# Patient Record
Sex: Female | Born: 1985 | Race: White | Hispanic: No | Smoking: Never smoker
Health system: Southern US, Community
[De-identification: ages and names within clinical notes are randomized; demographics above are authoritative.]

## PROBLEM LIST (undated history)

## (undated) VITALS — BP 136/94 | HR 92 | Temp 98.7°F | Resp 18 | Ht 70.0 in | Wt 190.0 lb

## (undated) DIAGNOSIS — Z8679 Personal history of other diseases of the circulatory system: Secondary | ICD-10-CM

## (undated) DIAGNOSIS — Z8619 Personal history of other infectious and parasitic diseases: Secondary | ICD-10-CM

## (undated) DIAGNOSIS — Z87898 Personal history of other specified conditions: Secondary | ICD-10-CM

## (undated) DIAGNOSIS — Z8709 Personal history of other diseases of the respiratory system: Secondary | ICD-10-CM

## (undated) DIAGNOSIS — Z87440 Personal history of urinary (tract) infections: Secondary | ICD-10-CM

## (undated) DIAGNOSIS — K219 Gastro-esophageal reflux disease without esophagitis: Secondary | ICD-10-CM

## (undated) DIAGNOSIS — E669 Obesity, unspecified: Secondary | ICD-10-CM

## (undated) DIAGNOSIS — F329 Major depressive disorder, single episode, unspecified: Secondary | ICD-10-CM

## (undated) DIAGNOSIS — Z8719 Personal history of other diseases of the digestive system: Secondary | ICD-10-CM

## (undated) DIAGNOSIS — F319 Bipolar disorder, unspecified: Secondary | ICD-10-CM

## (undated) DIAGNOSIS — F32A Depression, unspecified: Secondary | ICD-10-CM

## (undated) DIAGNOSIS — Z8669 Personal history of other diseases of the nervous system and sense organs: Secondary | ICD-10-CM

## (undated) HISTORY — DX: Personal history of other diseases of the respiratory system: Z87.09

## (undated) HISTORY — DX: Gastro-esophageal reflux disease without esophagitis: K21.9

## (undated) HISTORY — DX: Personal history of other diseases of the digestive system: Z87.19

## (undated) HISTORY — DX: Personal history of other infectious and parasitic diseases: Z86.19

## (undated) HISTORY — DX: Personal history of urinary (tract) infections: Z87.440

## (undated) HISTORY — DX: Personal history of other diseases of the nervous system and sense organs: Z86.69

## (undated) HISTORY — DX: Personal history of other specified conditions: Z87.898

## (undated) HISTORY — PX: OTHER SURGICAL HISTORY: SHX169

## (undated) HISTORY — DX: Personal history of other diseases of the circulatory system: Z86.79

---
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
## 2005-08-13 ENCOUNTER — Encounter (INDEPENDENT_AMBULATORY_CARE_PROVIDER_SITE_OTHER): Payer: Self-pay | Admitting: Specialist

## 2005-08-13 ENCOUNTER — Ambulatory Visit (HOSPITAL_COMMUNITY): Admission: RE | Admit: 2005-08-13 | Discharge: 2005-08-13 | Payer: Self-pay | Admitting: Obstetrics and Gynecology

## 2007-07-01 ENCOUNTER — Inpatient Hospital Stay (HOSPITAL_COMMUNITY): Admission: AD | Admit: 2007-07-01 | Discharge: 2007-07-01 | Payer: Self-pay | Admitting: Obstetrics

## 2007-10-28 ENCOUNTER — Encounter: Admission: RE | Admit: 2007-10-28 | Discharge: 2007-10-28 | Payer: Self-pay | Admitting: Emergency Medicine

## 2008-01-13 ENCOUNTER — Ambulatory Visit: Payer: Self-pay | Admitting: Gastroenterology

## 2008-09-22 HISTORY — PX: GASTRIC BYPASS: SHX52

## 2008-11-02 ENCOUNTER — Ambulatory Visit: Payer: Self-pay | Admitting: Family Medicine

## 2008-11-02 DIAGNOSIS — F411 Generalized anxiety disorder: Secondary | ICD-10-CM | POA: Insufficient documentation

## 2008-11-02 DIAGNOSIS — J209 Acute bronchitis, unspecified: Secondary | ICD-10-CM

## 2008-11-02 DIAGNOSIS — F3289 Other specified depressive episodes: Secondary | ICD-10-CM | POA: Insufficient documentation

## 2008-11-02 DIAGNOSIS — F329 Major depressive disorder, single episode, unspecified: Secondary | ICD-10-CM

## 2008-11-02 DIAGNOSIS — J019 Acute sinusitis, unspecified: Secondary | ICD-10-CM

## 2008-11-05 ENCOUNTER — Emergency Department (HOSPITAL_COMMUNITY): Admission: EM | Admit: 2008-11-05 | Discharge: 2008-11-05 | Payer: Self-pay | Admitting: Emergency Medicine

## 2008-12-21 HISTORY — PX: GASTRIC BYPASS: SHX52

## 2009-08-07 ENCOUNTER — Ambulatory Visit: Payer: Self-pay | Admitting: Family Medicine

## 2009-08-07 DIAGNOSIS — M940 Chondrocostal junction syndrome [Tietze]: Secondary | ICD-10-CM | POA: Insufficient documentation

## 2011-02-04 NOTE — Letter (Signed)
January 13, 2008    Reuben Likes, M.D.  [address]  [place], [place] [address]   RE:  CLARAMAE, RIGDON  MRN:  [id number]  /  DOB:  [birth year]   Dear Dr. Lorenz Coaster:   Upon your kind referral, I had the pleasure of evaluating your patient  and I am pleased to offer my findings.  I saw Tesha Archambeau in the office  today.  Enclosed is a copy of my progress note that details my findings  and recommendations.   Thank you for the opportunity to participate in your patient's care.    Sincerely,      Barbette Hair. Arlyce Dice, MD,FACG  Electronically Signed    RDK/MedQ  DD: 01/13/2008  DT: 01/13/2008  Job #: [id number]

## 2011-02-04 NOTE — Letter (Signed)
January 13, 2008    Oliver Hum   RE:  DORAL, DIGANGI  MRN:  [id number]  /  DOB:  [birth year]   Dear Ms. Argyle:   It is my pleasure to have treated you recently as a new patient in my  office.  I appreciate your confidence and the opportunity to participate  in your care.   Since I do have a busy inpatient endoscopy schedule and office schedule,  my office hours vary weekly.  I am, however, available for emergency  calls every day through my office.  If I cannot promptly meet an urgent  office appointment, another one of our gastroenterologists will be able  to assist you.   My well-trained staff are prepared to help you at all times.  For  emergencies after office hours, a physician from our gastroenterology  section is always available through my 24-hour answering service.   While you are under my care, I encourage discussion of your questions  and concerns, and I will be happy to return your calls as soon as I am  available.   Once again, I welcome you as a new patient and I look forward to a happy  and healthy relationship.    Sincerely,      Barbette Hair. Arlyce Dice, MD,FACG  Electronically Signed   RDK/MedQ  DD: 01/13/2008  DT: 01/13/2008  Job #: [id number]

## 2011-02-04 NOTE — Assessment & Plan Note (Signed)
[facility]                         GASTROENTEROLOGY OFFICE NOTE   Cindy [name], Cindy [name]                          MRN:          [id number]  DATE:01/13/2008                            DOB:          05/17/1986    REASON FOR CONSULTATION:  Abdominal pain.   Cindy [name] is a pleasant 25 year old white female referred through the  courtesy of Dr. Lorenz Coaster for evaluation.  Since August, 2008, she has been  bothered by left lower quadrant pain.  The pain is described as sharp  and may last several hours at a time.  It is unrelated to bowel  movements.  It is made neither better nor worse with bowel movements.  It is also unchanged by position including bending and turning.  He has  erratic bowels consisting of alternating episodes of diarrhea and  constipation.  Rarely has she seen small amounts of blood on the toilet  tissue.  She has taken Bentyl without much relief.  Her other main GI  complaint is pyrosis.  This is a persistent problem.  She has had  coughing and a sore throat despite taking Prilosec.  She complains of  excessive belching.  She denies dysphagia or odynophagia.  She noted the  onset of her left lower quadrant pain when she was pregnant in August.  The pregnancy was terminated.   PAST MEDICAL HISTORY:  Pertinent for:  1. Anxiety.  2. Depression.   FAMILY HISTORY:  Positive for father with heart disease.   MEDICATIONS:  Align, dicyclomine, and Prilosec.   SHE IS ALLERGIC TO CECLOR (SWELLING).   She does not smoke.  She drinks a few drinks a week.  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   REVIEW OF SYSTEMS:  Positive for feet swelling, sleeping problems, back  pain and muscle cramps, otherwise negative.   PHYSICAL EXAMINATION:  She is a healthy appearing female, pulse 82,  blood pressure 118/82, weight 239.  HEENT:  EOMI.  PERRLA.  Sclerae are anicteric.  Conjunctivae are pink.  NECK:  Supple without thyromegaly,  adenopathy or carotid bruits.  CHEST:  Clear to auscultation and percussion without adventitious  sounds.  CARDIAC:  Regular rhythm; normal S1 S2.  There are no murmurs, gallops  or rubs.  ABDOMEN:  There is minimal tenderness to deep palpation in the left  lower quadrant in the area of the left groin.  There are no abdominal  masses or organomegaly.  There are no frank hernias.  EXTREMITIES:  Full range of motion.  No cyanosis, clubbing or edema.  RECTAL:  Deferred.  SKELETAL:  There are no skeletal abnormalities including skeletal  deformities.  NEUROLOGIC:  Nonfocal.   IMPRESSION:  1. Persistent left lower quadrant pain.  Pain is atypical for GI pain      since it is unrelated to bowels.  This may be musculoskeletal pain.  2. Gastroesophageal reflux disease.  Symptoms so far are refractory to      Prilosec.   RECOMMENDATION:  1. Trial of Clinoril 200 mg twice a day.  2. Patient  will consider enrollment in a GERD trial.  Failing this, I      would try her on another PPI.     Barbette Hair. Arlyce Dice, MD,FACG  Electronically Signed    RDK/MedQ  DD: 01/13/2008  DT: 01/13/2008  Job #: [id number]   cc:   Reuben Likes, M.D.

## 2011-02-07 NOTE — Op Note (Signed)
Cindy [name], Cindy [name]                   ACCOUNT NO.:  [id number]   MEDICAL RECORD NO.:  [id number]          PATIENT TYPE:  AMB   LOCATION:  SDC                           FACILITY:  [facility]   PHYSICIAN:  Richardean Sale, M.D.   DATE OF BIRTH:  1985/10/16   DATE OF PROCEDURE:  08/13/2005  DATE OF DISCHARGE:                                 OPERATIVE REPORT   PREOPERATIVE DIAGNOSIS:  Cervical intraepithelial neoplasia, grade III.   POSTOPERATIVE DIAGNOSIS:  Cervical intraepithelial neoplasia, grade III.   PROCEDURE:  Loop electrosurgical excision procedure and CO2 laser to the  cervix.   SURGEON:  Richardean Sale, M.D.   ANESTHESIA:  General.   COMPLICATIONS:  None.   ESTIMATED BLOOD LOSS:  Minimal.   FINDINGS:  Colposcopic examination showed a well demarcated, acetowhite  lesion with mosaicism and punctation, no atypical vessels extending  circumferentially around the transformation zone with extension from the 5  to 7 o'clock position to the edge of the ectocervix.  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   SPECIMENS:  Cervical LEEP specimen sent to pathology.   INDICATIONS:  This is a 25 year old gravida 0 white female who underwent  colposcopic examination for an abnormal Pap smear.  Biopsies confirmed CIN-  III, with a negative ECC.  Given the degree of cervical dysplasia, and the  extent of the lesion on colposcopic examination, the patient presents today  for LEEP excision and CO2 laser of the cervix.  Prior to the procedure, the  risks, benefits and alternatives of the procedure were reviewed with the  patient in detail.  We discussed the risk of bleeding, infection, cervical  stenosis, and/or cervical incompetence, which could result in possible  preterm delivery in the future, reviewed the possibility of recurrent  cervical neoplasia, and the possibility of requiring an additional procedure  at some point in the future.  Prior to the procedure,  the patient voiced  understanding of all these risks and desired to proceed.  Informed consent  was obtained before proceeding to the OR.   DESCRIPTION OF PROCEDURE:  The patient was taken to the operating room where  she was given a general anesthetic.  She was then placed in the dorsal  lithotomy position and wet towels were placed along the mons and the inner  thighs.  The LEEP speculum was then introduced into the vagina and the  cervix was then examined using the colposcope.  Acetic acid was first  applied, which again confirmed the presence of a large acetowhite lesion  with mosaicism and punctation noted.  At this point, Lugol solution was then  applied to better delineate the margins of the lesion and 10 mL of 0.25%  Marcaine with epinephrine was then injected circumferentially around the  cervix.  At this point, the 12 x 15 mm loop electrode was then selected for  the central excision.  This was attached to the Bovie and the Bovie was set  to 40/50 and the LEEP excision was then performed.  The specimen was removed  in two pieces  and was labeled at the 3 o'clock and 9 o'clock positions and  fixed to cork board and sent to pathology.  At this point, there was normal  bleeding coming from the base of the excision.  There was still evidence of  dysplasia on the posterior aspect of this excision, extending to the edge of  the ectocervix.  At this point, the LEEP speculum was then removed and the  laser-safe speculum was then inserted.  The laser was then attached to the  colposcope, the laser was tested using a sterile tongue blade.  Power was  set to 10 watts and laser vaporization was then performed on the additional  acetowhite areas of the cervix.  A laser-safe skin hook was then used to  examine the posterior aspect of the cervix under colposcopic examination to  assure that all acetowhite areas had been ablated.  Depth of ablation was to  5 to 7 mm.  Once the laser vaporization  was completed, an ECC was then  performed and sent to pathology.  Equipment was then switched back to the  LEEP speculum, and the 5 mm ball cautery was then used to fulgurate the base  of the crater and to control any additional bleeding.  Monsel solution was  then applied with good hemostasis.  At this point, the procedure was  terminated, all instruments were removed from the patient's vagina.  All  sponge, lap, needle, and instrument counts were correct x 2.  The patient  was taken out of the dorsal lithotomy position and awakened from anesthesia,  and taken to the recovery room awake and in stable condition.  There were no  complications.      Richardean Sale, M.D.  Electronically Signed     JW/MEDQ  D:  08/14/2005  T:  08/14/2005  Job:  [id number]

## 2011-07-03 LAB — CBC
HCT: 38.7
Hemoglobin: 13.3
MCV: 81.7
RBC: 4.73
WBC: 11.9 — ABNORMAL HIGH

## 2011-07-03 LAB — WET PREP, GENITAL
Clue Cells Wet Prep HPF POC: NONE SEEN
Trich, Wet Prep: NONE SEEN
Yeast Wet Prep HPF POC: NONE SEEN

## 2013-04-03 DIAGNOSIS — K5900 Constipation, unspecified: Secondary | ICD-10-CM | POA: Insufficient documentation

## 2013-04-03 DIAGNOSIS — R42 Dizziness and giddiness: Secondary | ICD-10-CM | POA: Insufficient documentation

## 2013-04-03 DIAGNOSIS — R112 Nausea with vomiting, unspecified: Secondary | ICD-10-CM | POA: Insufficient documentation

## 2013-04-03 DIAGNOSIS — R630 Anorexia: Secondary | ICD-10-CM | POA: Insufficient documentation

## 2013-04-03 DIAGNOSIS — Z9884 Bariatric surgery status: Secondary | ICD-10-CM | POA: Insufficient documentation

## 2013-04-04 ENCOUNTER — Encounter (HOSPITAL_COMMUNITY): Payer: Self-pay | Admitting: Emergency Medicine

## 2013-04-04 ENCOUNTER — Emergency Department (HOSPITAL_COMMUNITY): Payer: BC Managed Care – PPO

## 2013-04-04 ENCOUNTER — Emergency Department (HOSPITAL_COMMUNITY)
Admission: EM | Admit: 2013-04-04 | Discharge: 2013-04-04 | Disposition: A | Discharge status: Home / Self Care | Payer: BC Managed Care – PPO | Attending: Emergency Medicine | Admitting: Emergency Medicine

## 2013-04-04 DIAGNOSIS — K5900 Constipation, unspecified: Secondary | ICD-10-CM

## 2013-04-04 DIAGNOSIS — Z9884 Bariatric surgery status: Secondary | ICD-10-CM

## 2013-04-04 DIAGNOSIS — R630 Anorexia: Secondary | ICD-10-CM

## 2013-04-04 DIAGNOSIS — R112 Nausea with vomiting, unspecified: Secondary | ICD-10-CM

## 2013-04-04 HISTORY — DX: Major depressive disorder, single episode, unspecified: F32.9

## 2013-04-04 HISTORY — DX: Obesity, unspecified: E66.9

## 2013-04-04 HISTORY — DX: Depression, unspecified: F32.A

## 2013-04-04 LAB — URINALYSIS, ROUTINE W REFLEX MICROSCOPIC
Bilirubin Urine: NEGATIVE
Nitrite: NEGATIVE
Specific Gravity, Urine: 1.015 (ref 1.005–1.030)
Urobilinogen, UA: 0.2 mg/dL (ref 0.0–1.0)
pH: 5.5 (ref 5.0–8.0)

## 2013-04-04 LAB — COMPREHENSIVE METABOLIC PANEL
ALT: 20 U/L (ref 0–35)
Albumin: 4.2 g/dL (ref 3.5–5.2)
Alkaline Phosphatase: 127 U/L — ABNORMAL HIGH (ref 39–117)
Calcium: 9.7 mg/dL (ref 8.4–10.5)
GFR calc Af Amer: >90 mL/min (ref >90)
Potassium: 5.2 mEq/L — ABNORMAL HIGH (ref 3.5–5.1)
Sodium: 135 mEq/L (ref 135–145)
Total Protein: 7.5 g/dL (ref 6.0–8.3)

## 2013-04-04 LAB — CBC WITH DIFFERENTIAL/PLATELET
Basophils Absolute: 0 10*3/uL (ref 0.0–0.1)
Basophils Relative: 0 % (ref 0–1)
Eosinophils Absolute: 0.2 10*3/uL (ref 0.0–0.7)
Eosinophils Relative: 2 % (ref 0–5)
HCT: 31.3 % — ABNORMAL LOW (ref 36.0–46.0)
MCHC: 32.6 g/dL (ref 30.0–36.0)
MCV: 74.9 fL — ABNORMAL LOW (ref 78.0–100.0)
Monocytes Absolute: 1.1 10*3/uL — ABNORMAL HIGH (ref 0.1–1.0)
Platelets: 229 10*3/uL (ref 150–400)
RDW: 16.9 % — ABNORMAL HIGH (ref 11.5–15.5)
WBC: 13.4 10*3/uL — ABNORMAL HIGH (ref 4.0–10.5)

## 2013-04-04 LAB — URINE MICROSCOPIC-ADD ON

## 2013-04-04 LAB — LIPASE, BLOOD: Lipase: 41 U/L (ref 11–59)

## 2013-04-04 LAB — POCT PREGNANCY, URINE: Preg Test, Ur: NEGATIVE

## 2013-04-04 MED ORDER — ONDANSETRON 4 MG PO TBDP: 4.0000 mg | ORAL_TABLET | Freq: Four times a day (QID) | ORAL | Status: DC | PRN

## 2013-04-04 MED ORDER — ONDANSETRON HCL 4 MG/2ML IJ SOLN: 4.0000 mg | Freq: Three times a day (TID) | INTRAMUSCULAR | Status: DC | PRN

## 2013-04-04 MED ORDER — MILK AND MOLASSES ENEMA
Freq: Once | RECTAL | Status: AC
  Administered 2013-04-04: 04:00:00 via RECTAL
  Filled 2013-04-04: qty 250

## 2013-04-04 MED ORDER — ONDANSETRON HCL 4 MG/2ML IJ SOLN
4.0000 mg | Freq: Once | INTRAMUSCULAR | Status: AC
  Administered 2013-04-04: 4 mg via INTRAVENOUS
  Filled 2013-04-04: qty 2

## 2013-04-04 MED ORDER — LACTATED RINGERS IV BOLUS (SEPSIS)
1000.0000 mL | Freq: Once | INTRAVENOUS | Status: AC
  Administered 2013-04-04: 1000 mL via INTRAVENOUS

## 2013-04-04 NOTE — ED Notes (Signed)
Pt states that she had gastric bypass four years ago and she has been having trouble with n/v, weakness, dizziness, and has been unable to eat or drink for the last two weeks. Pt is also c/o HAs.

## 2013-04-04 NOTE — ED Notes (Signed)
Patient transported to X-ray 

## 2013-04-04 NOTE — ED Provider Notes (Signed)
History    CSN: [id number] Arrival date & time 04/03/13  2338  First MD Initiated Contact with Patient 04/04/13 0046     Chief Complaint  Patient presents with  . Dizziness  . Nausea  . Emesis   HPI Cindy [name] is a 27 y.o. female has a history of bariatric surgery in 2010 performed at [facility] by Dr. Clent Ridges, patient had a sleeve gastrectomy.  For the last 2 weeks she's had worsening weakness and poor appetite, she's had a history of constipation recently, tonight she presents with weakness dizziness that is most apparent on changing positions and nausea and vomiting. Patient says she's also been eating quite a bit of ice and has been anemic in the past. Patient says she started Lamictal 2 months ago and after initiation is had some redness on the back of her neck, no blistering, no lesions to mucous membranes. She's also increased acre.   Past Medical History  Diagnosis Date  . Obesity   . Depression    Past Surgical History  Procedure Laterality Date  . Gastric bypass  12/2008  . Leap     No family history on file. History  Substance Use Topics  . Smoking status: Never Smoker   . Smokeless tobacco: Never Used  . Alcohol Use: 1.5 oz/week    3 drink(s) per week   OB History   Grav Para Term Preterm Abortions TAB SAB Ect Mult Living                 Review of Systems At least 10pt or greater review of systems completed and are negative except where specified in the HPI.  Allergies  Cefaclor  Home Medications   Current Outpatient Rx  Name  Route  Sig  Dispense  Refill  . ALPRAZolam (XANAX) 1 MG tablet   Oral   Take 1 mg by mouth 4 (four) times daily as needed for anxiety.         . calcium carbonate (OS-CAL - DOSED IN MG OF ELEMENTAL CALCIUM) 1250 MG tablet   Oral   Take 1 tablet by mouth 2 (two) times daily with a meal.         . cholecalciferol (VITAMIN D) 1000 UNITS tablet   Oral   Take 4,000 Units by mouth daily.         . Cyanocobalamin  (VITAMIN B-12 IJ)   Injection   Inject 1 mg as directed once a week.         . DULoxetine (CYMBALTA) 60 MG capsule   Oral   Take 60 mg by mouth daily.         . ferrous sulfate 325 (65 FE) MG tablet   Oral   Take 325 mg by mouth 3 (three) times daily with meals.         . lamoTRIgine (LAMICTAL) 25 MG tablet   Oral   Take 100 mg by mouth daily.         . Multiple Vitamin (MULTIVITAMIN WITH MINERALS) TABS   Oral   Take 1 tablet by mouth 3 (three) times daily.         . Nutritional Supp - Diet Aids (ULTRA SLIM QUICK PO)   Oral   Take 1 tablet by mouth daily.          BP 136/98  Pulse 81  Temp(Src) 98.3 F (36.8 C) (Oral)  Resp 16  Ht 5\' 10"  (1.778 m)  Wt 185  lb (83.915 kg)  BMI 26.54 kg/m2  LMP 03/22/2013 Physical Exam  Nursing notes reviewed.  Electronic medical record reviewed. VITAL SIGNS:   Filed Vitals:   04/04/13 0023 04/04/13 0452 04/04/13 0544  BP: 136/98 127/94 132/96  Pulse: 81 60 65  Temp: 98.3 F (36.8 C) 98.7 F (37.1 C) 98 F (36.7 C)  TempSrc: Oral Oral Oral  Resp: 16 18 20   Height: 5\' 10"  (1.778 m)    Weight: 185 lb (83.915 kg)    SpO2:  98% 99%   CONSTITUTIONAL: Awake, oriented, appears non-toxic HENT: Atraumatic, normocephalic, oral mucosa pink and moist, airway patent. Nares patent without drainage. External ears normal. EYES: Conjunctiva clear, EOMI, PERRLA NECK: Trachea midline, non-tender, supple CARDIOVASCULAR: Normal heart rate, Normal rhythm, No murmurs, rubs, gallops PULMONARY/CHEST: Clear to auscultation, no rhonchi, wheezes, or rales. Symmetrical breath sounds. Non-tender. ABDOMINAL: Non-distended, soft, non-tender - no rebound or guarding.  BS normal. NEUROLOGIC: Non-focal, moving all four extremities, no gross sensory or motor deficits. EXTREMITIES: No clubbing, cyanosis, or edema SKIN: Warm, Dry, No erythema. Mild acne on chest.  ED Course  Procedures (including critical care time) Labs Reviewed  COMPREHENSIVE  METABOLIC PANEL - Abnormal; Notable for the following:    Potassium 5.2 (*)    Alkaline Phosphatase 127 (*)    Total Bilirubin 0.2 (*)    All other components within normal limits  URINALYSIS, ROUTINE W REFLEX MICROSCOPIC - Abnormal; Notable for the following:    Hgb urine dipstick TRACE (*)    Leukocytes, UA SMALL (*)    All other components within normal limits  CBC WITH DIFFERENTIAL - Abnormal; Notable for the following:    WBC 13.4 (*)    Hemoglobin 10.2 (*)    HCT 31.3 (*)    MCV 74.9 (*)    MCH 24.4 (*)    RDW 16.9 (*)    Neutro Abs 9.3 (*)    Monocytes Absolute 1.1 (*)    All other components within normal limits  LIPASE, BLOOD  URINE MICROSCOPIC-ADD ON  CBC WITH DIFFERENTIAL  POCT PREGNANCY, URINE   No results found. 1. Nausea and vomiting   2. Constipation   3. Anorexia   4. H/O gastric bypass     MDM  Patient's abdomen is benign, presentation consistent with constipation-seen on x-rays, labs otherwise unremarkable. Patient chronically anemic, she has some pica, instructed her to followup with her bariatric surgeon, continue taking iron.  Pt feeling better after treatment.  I explained the diagnosis and have given explicit precautions to return to the ER including any other new or worsening symptoms. The patient understands and accepts the medical plan as it's been dictated and I have answered their questions. Discharge instructions concerning home care and prescriptions have been given.  The patient is STABLE and is discharged to home in good condition.   Jones Skene, MD 04/08/13 0006

## 2013-04-27 ENCOUNTER — Encounter: Payer: Self-pay | Admitting: Family Medicine

## 2013-04-27 ENCOUNTER — Ambulatory Visit (INDEPENDENT_AMBULATORY_CARE_PROVIDER_SITE_OTHER): Payer: BC Managed Care – PPO | Admitting: Family Medicine

## 2013-04-27 VITALS — BP 126/88 | HR 91 | Temp 99.0°F | Ht 69.5 in | Wt 181.5 lb

## 2013-04-27 DIAGNOSIS — L708 Other acne: Secondary | ICD-10-CM

## 2013-04-27 DIAGNOSIS — Z23 Encounter for immunization: Secondary | ICD-10-CM

## 2013-04-27 DIAGNOSIS — Z9884 Bariatric surgery status: Secondary | ICD-10-CM

## 2013-04-27 DIAGNOSIS — R300 Dysuria: Secondary | ICD-10-CM

## 2013-04-27 DIAGNOSIS — D509 Iron deficiency anemia, unspecified: Secondary | ICD-10-CM | POA: Insufficient documentation

## 2013-04-27 DIAGNOSIS — E538 Deficiency of other specified B group vitamins: Secondary | ICD-10-CM

## 2013-04-27 DIAGNOSIS — L709 Acne, unspecified: Secondary | ICD-10-CM

## 2013-04-27 DIAGNOSIS — E559 Vitamin D deficiency, unspecified: Secondary | ICD-10-CM | POA: Insufficient documentation

## 2013-04-27 LAB — POCT URINALYSIS DIPSTICK
Spec Grav, UA: 1.015
Urobilinogen, UA: 0.2
pH, UA: 6

## 2013-04-27 MED ORDER — CLINDAMYCIN PHOSPHATE 1 % EX LOTN: TOPICAL_LOTION | Freq: Two times a day (BID) | CUTANEOUS | Status: DC

## 2013-04-27 NOTE — Patient Instructions (Addendum)
Follow up with your psychiatrist as planned Also return to your counselor  Schedule non fasting lab in 2-3 months and then follow up  Try the cleocin lotion topically on acne prone areas Leave a urine specimen on the way out please  Tetanus shot today

## 2013-04-27 NOTE — Progress Notes (Signed)
Subjective:    Patient ID: Cindy [name], female    DOB: [birth year], 27 y.o.   MRN: [id number]  HPI Here to est as a new pt for primary care   Bariatric surgery in 2010 - sleeve  Top wt was 273 and now BMI 26.4  Had some internal bleeding - no complications after that   Recently divorced (her husb was a nurse)-not taking care of herself as much since then  In past 3 months has come back to a better lifestyle   Labs recently from her surgery center -- vit D level is 49 , PTH is ok , and ferritin is low at 7.6  Hb of 11.1 (that was improved) B12 level was too high - with shots - so got to stop them  Her surgeon and nurse are leaving   Now on MVI with iron bid  Also 325 mg ferrous sulfate once per day   Periods used to be heavy -- now has mirena IUD and doing much better   On cymbalta - for depression and anxiety  Sees Dr Evelene Croon in [place]  She was on xanax 4 mg per day and she decided to stop it cold [place]  Has been about 3 weeks - had some withdrawal  Has appt with her on the 19th  Feels better off the xanax overall - some anxiety at night occasionally  Took neurontin to prevent seizures  Never had a seizure in the past  Started lamictal recently                                       (of note her mother has bipolar and schizophrenia) trazadone is for sleep as needed - hardly ever takes it   Remote hx of elevated bp at times (nothing consistent)  Low grade fever - for about 3 months - a little more aches and pains in general , and also has headaches (anxiety causes those)  Today -a bit of pain on urination    Has had a bit of acne lately  Some pimples on face  Some bumps on her neck and back - ? If acne - she went to [facility] - did not know what it was    Patient Active Problem List   Diagnosis Date Noted  . COSTOCHONDRITIS, ACUTE 08/07/2009  . ANXIETY 11/02/2008  . DEPRESSION 11/02/2008  . ACUTE SINUSITIS, UNSPECIFIED 11/02/2008  . ACUTE BRONCHITIS 11/02/2008   Past  Medical History  Diagnosis Date  . Obesity   . Depression   . History of chicken pox     around age 35  . History of colonic diverticulitis     once at age 52  . History of migraine   . History of hay fever   . History of high blood pressure   . History of UTI   . History of heartburn     and GERD sometimes    Past Surgical History  Procedure Laterality Date  . Gastric bypass  12/2008  . Leap    . Tonsillectomy and adenoidectomy  1991   History  Substance Use Topics  . Smoking status: Never Smoker   . Smokeless tobacco: Never Used  . Alcohol Use: 1.5 oz/week    3 drink(s) per week     Comment: occ   Family History  Problem Relation Age of Onset  . Schizophrenia Mother   .  Bipolar disorder Mother   . Alcohol abuse Maternal Grandfather   . Arthritis Mother     in her back  . Heart disease Paternal Grandmother   . High blood pressure Father   . Diabetes Paternal Aunt    Allergies  Allergen Reactions  . Cefaclor     REACTION: Hives   Current Outpatient Prescriptions on File Prior to Visit  Medication Sig Dispense Refill  . ALPRAZolam (XANAX) 1 MG tablet Take 1 mg by mouth 4 (four) times daily as needed for anxiety.      . calcium carbonate (OS-CAL - DOSED IN MG OF ELEMENTAL CALCIUM) 1250 MG tablet Take 1 tablet by mouth 2 (two) times daily with a meal.      . cholecalciferol (VITAMIN D) 1000 UNITS tablet Take 4,000 Units by mouth daily.      . DULoxetine (CYMBALTA) 60 MG capsule Take 60 mg by mouth daily.      . ferrous sulfate 325 (65 FE) MG tablet Take 325 mg by mouth 3 (three) times daily with meals.      . lamoTRIgine (LAMICTAL) 25 MG tablet Take 100 mg by mouth daily.      . Multiple Vitamin (MULTIVITAMIN WITH MINERALS) TABS Take 1 tablet by mouth 3 (three) times daily.      . ondansetron (ZOFRAN ODT) 4 MG disintegrating tablet Take 1 tablet (4 mg total) by mouth every 6 (six) hours as needed for nausea.  10 tablet  0   No current facility-administered  medications on file prior to visit.       Review of Systems Review of Systems  Constitutional: Negative for fever, appetite change, and unexpected weight change.  Eyes: Negative for pain and visual disturbance.  Respiratory: Negative for cough and shortness of breath.   Cardiovascular: Negative for cp or palpitations    Gastrointestinal: Negative for nausea, diarrhea and constipation.  Genitourinary: Negative for urgency and frequency.  Skin: Negative for pallor or rash  pos for acne type bumps Neurological: Negative for weakness, light-headedness, numbness and headaches.  Hematological: Negative for adenopathy. Does not bruise/bleed easily.  Psychiatric/Behavioral: pos for depression that is fairly controlled and anxiety- a bit worse after stopping xanax         Objective:   Physical Exam  Constitutional: She appears well-developed and well-nourished. No distress.  HENT:  Head: Normocephalic and atraumatic.  Right Ear: External ear normal.  Left Ear: External ear normal.  Nose: Nose normal.  Mouth/Throat: Oropharynx is clear and moist.  Eyes: Conjunctivae and EOM are normal. Pupils are equal, round, and reactive to light. No scleral icterus.  Neck: Normal range of motion. Neck supple. No JVD present. Carotid bruit is not present. No thyromegaly present.  Cardiovascular: Normal rate, regular rhythm, normal heart sounds and intact distal pulses.  Exam reveals no gallop.   Pulmonary/Chest: Effort normal and breath sounds normal. No respiratory distress. She has no wheezes. She has no rales.  Abdominal: Soft. Bowel sounds are normal. She exhibits no distension, no abdominal bruit and no mass. There is no tenderness.  Musculoskeletal: She exhibits no edema and no tenderness.  Lymphadenopathy:    She has no cervical adenopathy.  Neurological: She is alert. She has normal reflexes. No cranial nerve deficit. She exhibits normal muscle tone. Coordination normal.  Skin: Skin is warm and  dry. No erythema. No pallor.  Some comedones / mild on chin Folliculitis like papules on back of neck and upper back   No pustules or erythema  Psychiatric: She has a normal mood and affect.          Assessment & Plan:

## 2013-04-28 ENCOUNTER — Telehealth: Payer: Self-pay | Admitting: Family Medicine

## 2013-04-28 DIAGNOSIS — R300 Dysuria: Secondary | ICD-10-CM

## 2013-04-28 NOTE — Assessment & Plan Note (Signed)
B12 is high so she has stopped shots Re check in 2 mo and make plan

## 2013-04-28 NOTE — Assessment & Plan Note (Signed)
Will take over her nutritional monitoring here Mildly anemic with low ferritin  She has recently started ferrous sulfate and also takes mvi plus iron Re check 2 mo and f/u -if unable to abs iron then IV tx would have to be considered

## 2013-04-28 NOTE — Telephone Encounter (Signed)
Message copied by Judy Pimple on Thu Apr 28, 2013 10:29 AM ------      Message from: Alvina Chou      Created: Thu Apr 28, 2013  9:43 AM      Regarding: urine culture?       Did you want a culture? Shapale put the specimen in the fridge. Didn't see anything in your notes. Thanks, T ------

## 2013-04-28 NOTE — Assessment & Plan Note (Signed)
On ferrous sulfate now s/p bariatric surg Re check 2 mo and f/u

## 2013-04-28 NOTE — Assessment & Plan Note (Signed)
Rev labs from prev physician -D level ok on current supplementation Per pt dexa in past was normal

## 2013-04-28 NOTE — Assessment & Plan Note (Signed)
Given some cleocin lotion to try on affected areas

## 2013-04-28 NOTE — Assessment & Plan Note (Signed)
ua borderline today Pt also has low grade fever (per pt for months) - will cx urine

## 2013-04-28 NOTE — Telephone Encounter (Signed)
Yes-please I forgot to order it  thanks

## 2013-04-29 ENCOUNTER — Telehealth: Payer: Self-pay

## 2013-04-29 NOTE — Telephone Encounter (Signed)
Pt request new rx sent to [facility] for clindamycin lotion rx. Advised pt [facility] can contact [facility] for transfer of rx. Pt said [facility] did not have rx; spoke with Sheralyn Boatman at [facility] and they do have rx. Pt will contact [facility] for transfer.

## 2013-05-03 ENCOUNTER — Encounter: Payer: Self-pay | Admitting: Surgery

## 2013-07-28 ENCOUNTER — Other Ambulatory Visit: Payer: Self-pay

## 2013-09-23 ENCOUNTER — Inpatient Hospital Stay (HOSPITAL_COMMUNITY)
Admission: AD | Admit: 2013-09-23 | Discharge: 2013-09-28 | DRG: 885 | Disposition: A | Discharge status: Home / Self Care | Payer: BC Managed Care – PPO | Attending: Psychiatry | Admitting: Psychiatry

## 2013-09-23 ENCOUNTER — Encounter (HOSPITAL_COMMUNITY): Payer: Self-pay | Admitting: *Deleted

## 2013-09-23 DIAGNOSIS — F411 Generalized anxiety disorder: Secondary | ICD-10-CM | POA: Diagnosis present

## 2013-09-23 DIAGNOSIS — R45851 Suicidal ideations: Secondary | ICD-10-CM

## 2013-09-23 DIAGNOSIS — F332 Major depressive disorder, recurrent severe without psychotic features: Principal | ICD-10-CM | POA: Diagnosis present

## 2013-09-23 DIAGNOSIS — Z79899 Other long term (current) drug therapy: Secondary | ICD-10-CM

## 2013-09-23 LAB — RAPID URINE DRUG SCREEN, HOSP PERFORMED
AMPHETAMINES: NOT DETECTED
BENZODIAZEPINES: POSITIVE — AB
Barbiturates: NOT DETECTED
Cocaine: NOT DETECTED
Opiates: NOT DETECTED
TETRAHYDROCANNABINOL: NOT DETECTED

## 2013-09-23 LAB — PREGNANCY, URINE: PREG TEST UR: NEGATIVE

## 2013-09-23 MED ORDER — TRAZODONE HCL 50 MG PO TABS
50.0000 mg | ORAL_TABLET | Freq: Every evening | ORAL | Status: DC | PRN
  Administered 2013-09-24 – 2013-09-27 (×5): 50 mg via ORAL
  Filled 2013-09-23 (×4): qty 1

## 2013-09-23 MED ORDER — MAGNESIUM HYDROXIDE 400 MG/5ML PO SUSP: 30.0000 mL | Freq: Every day | ORAL | Status: DC | PRN

## 2013-09-23 MED ORDER — ALUM & MAG HYDROXIDE-SIMETH 200-200-20 MG/5ML PO SUSP
30.0000 mL | ORAL | Status: DC | PRN
  Administered 2013-09-24 – 2013-09-26 (×2): 30 mL via ORAL

## 2013-09-23 MED ORDER — ACETAMINOPHEN 325 MG PO TABS
650.0000 mg | ORAL_TABLET | Freq: Four times a day (QID) | ORAL | Status: DC | PRN
  Administered 2013-09-23 – 2013-09-28 (×8): 650 mg via ORAL
  Filled 2013-09-23 (×8): qty 2

## 2013-09-23 NOTE — Progress Notes (Signed)
28 year old female pt admitted on voluntary basis. During admission process, pt spoke about how she has been feeling depressed, anxious and suicidal and spoke about how she believes that medication changes may be contributing to these feelings. Pt is able to contract for safety on the unit. Pt reports being depressed since about the age of 28. Pt does report that she has gotten back on xanax recently and takes 1mg  four times a day. Pt was oriented to the unit and safety maintained.

## 2013-09-23 NOTE — Progress Notes (Signed)
Pt stated she has a IUD

## 2013-09-23 NOTE — Tx Team (Signed)
Initial Interdisciplinary Treatment Plan  PATIENT STRENGTHS: (choose at least two) Ability for insight Average or above average intelligence Capable of independent living Motivation for treatment/growth Supportive family/friends  PATIENT STRESSORS: Medication change or noncompliance   PROBLEM LIST: Problem List/Patient Goals Date to be addressed Date deferred Reason deferred Estimated date of resolution  Depression 09/23/13     Anxiety 09/23/13     Suicidal Ideation 09/23/13                                          DISCHARGE CRITERIA:  Ability to meet basic life and health needs Improved stabilization in mood, thinking, and/or behavior Verbal commitment to aftercare and medication compliance  PRELIMINARY DISCHARGE PLAN: Attend aftercare/continuing care group Return to previous living arrangement  PATIENT/FAMIILY INVOLVEMENT: This treatment plan has been presented to and reviewed with the patient, Levert FeinsteinMary E Macleod, and/or family member, .  The patient and family have been given the opportunity to ask questions and make suggestions.  Zareya Tuckett, [place] 09/23/2013, 6:29 PM

## 2013-09-23 NOTE — BH Assessment (Signed)
Assessment Note  Cindy [name] is an 28 y.o. female that was referred by her psychiatrist, Dr. Evelene Croon, as a walk-in to [facility].  Pt presents with her parents reporting SI with a plan to overdose.  Pt stated she has been having thoughts of suicide, "or not wanting to be here," and "having no purpose in life," for approximately 4 months.  Pt reports she has had depression for many years, but has not felt this way before.  Pt stated Dr. Evelene Croon recently changed her medications, as she felt her current regimen wasn't helping as well as it used to.  Pt stated she has been afraid that she will develop Bipolar Disorder like her mother has.  Pt stated Dr. Evelene Croon recently added an unknown medication for Bipolar Disorder to her medication regimen.  She has also recently gone back on Xanax after being off for 3 months by report and has also increased her dosage of Cymbalta to 90 mg.  Pt also takes Trazadone sporadically for sleep if needed by report.  Pt reported she has been breaking the capsules of Cymbalta to swallow them recently because of them not being absorbed as well since having her gastric bypass surgery in 2010.  Pt stated she has lost 85 lbs.  Pt reported she has daily panic attacks and takes Xanax so she can rest and sleep, and sleeps as much as she can by report.  Other neurovegetative sx of depression that the pt presents with are decreased grooming and hygiene.  Pt cried through most of the session and asked for help.  She has had no previous suicide attempts.  She has had no previous inpt treatment, only outpt treatment since her divorce.  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  Pt was pleasant and cooperative during assessment and agrees that she needs inpt treatment.  Pt denies the use  of alcohol or drugs.  She denies HI or psychosis.  Pt's parents are also supportive.  Consulted with Berneice Heinrich, [facility], and Dr. Elsie Saas, who accepted pt to [facility] to [id number] @ [phone].  Pt admitted to [facility].  TTS staff notified.   Axis I: 296.33 Major Depressive Disorder, Recurrent, Severe Without Psychotic Features Axis II: Deferred Axis III:  Past Medical History  Diagnosis Date  . Obesity   . Depression   . History of chicken pox     around age 42  . History of colonic diverticulitis     once at age 22  . History of migraine   . History of hay fever   . History of high blood pressure   . History of UTI   . History of heartburn     and GERD sometimes    Axis IV: economic problems, housing problems, occupational problems, other psychosocial or environmental problems and problems with primary support group Axis V: 21-30 behavior considerably influenced by delusions or hallucinations OR serious impairment in judgment, communication OR inability to function in almost all areas  Past Medical History:  Past Medical History  Diagnosis Date  . Obesity   . Depression   . History of chicken pox     around age 60  . History of colonic diverticulitis     once at age 58  .  History of migraine   . History of hay fever   . History of high blood pressure   . History of UTI   . History of heartburn     and GERD sometimes     Past Surgical History  Procedure Laterality Date  . Gastric bypass  12/2008  . Leap    . Tonsillectomy and adenoidectomy  1991    Family History:  Family History  Problem Relation Age of Onset  . Schizophrenia Mother   . Bipolar disorder Mother   . Alcohol abuse Maternal Grandfather   . Arthritis Mother     in her back  . Heart disease Paternal Grandmother   . High blood pressure Father   . Diabetes Paternal Aunt     Social History:  reports that she has never smoked. She has never used smokeless tobacco. She reports that she does not drink alcohol or use  illicit drugs.  Additional Social History:  Alcohol / Drug Use Pain Medications: see med list Prescriptions: see med list Over the Counter: see med list History of alcohol / drug use?: No history of alcohol / drug abuse Longest period of sobriety (when/how long):  (na) Negative Consequences of Use:  (na) Withdrawal Symptoms:  (na)  CIWA:   COWS:    Allergies:  Allergies  Allergen Reactions  . Cefaclor     REACTION: Hives    Home Medications:  No prescriptions prior to admission    OB/GYN Status:  No LMP recorded.  General Assessment Data Location of Assessment: [facility] Services Is this a Tele or Face-to-Face Assessment?: Face-to-Face Is this an Initial Assessment or a Re-assessment for this encounter?: Initial Assessment Living Arrangements: Parent Can pt return to current living arrangement?: Yes Admission Status: Voluntary Is patient capable of signing voluntary admission?: Yes Transfer from: Acute Hospital Referral Source: Psychiatrist  Medical Screening Exam [facility] Walk-in ONLY) Medical Exam completed: No Reason for MSE not completed: Other: (pt being admitted to [facility])  [facility] Crisis Care Plan Living Arrangements: Parent Name of Psychiatrist: Dr. Milagros Evener Name of Therapist: none  Education Status Is patient currently in school?: No Highest grade of school patient has completed: Bachelor's Degree Contact person: self  Risk to self Suicidal Ideation: Yes-Currently Present Suicidal Intent: Yes-Currently Present Is patient at risk for suicide?: Yes Suicidal Plan?: Yes-Currently Present Specify Current Suicidal Plan: to overdose on medications Access to Means: Yes Specify Access to Suicidal Means: has access to medications What has been your use of drugs/alcohol within the last 12 months?: pt denies Previous Attempts/Gestures: No How many times?: 0 Other Self Harm Risks: pt denies Triggers for Past Attempts: None known Intentional Self Injurious  Behavior: None Family Suicide History: No Recent stressful life event(s): Loss (Comment);Job Loss;Recent negative physical changes;Trauma (Comment);Turmoil (Comment) (SI, Depression, divorce, loss of job, recent med changes) Persecutory voices/beliefs?: No Depression: Yes Depression Symptoms: Despondent;Tearfulness;Isolating;Fatigue;Loss of interest in usual pleasures;Feeling worthless/self pity;Feeling angry/irritable Substance abuse history and/or treatment for substance abuse?: No Suicide prevention information given to non-admitted patients: Not applicable  Risk to Others Homicidal Ideation: No Thoughts of Harm to Others: No Current Homicidal Intent: No Current Homicidal Plan: No Access to Homicidal Means: No Identified Victim: pt denies History of harm to others?: No Assessment of Violence: None Noted Violent Behavior Description: na - pt cooperative Does patient have access to weapons?: No Criminal Charges Pending?: No Does patient have a court date: No  Psychosis Hallucinations: None noted Delusions: None noted  Mental Status Report Appear/Hygiene:  Disheveled Eye Contact: Good Motor Activity: Freedom of movement Speech: Logical/coherent Level of Consciousness: Alert;Crying Mood: Depressed;Anxious Affect: Depressed;Anxious Anxiety Level: Panic Attacks Panic attack frequency: daily Most recent panic attack: today Thought Processes: Coherent;Relevant Judgement: Unimpaired Orientation: Person;Place;Time;Situation;Appropriate for developmental age Obsessive Compulsive Thoughts/Behaviors: None  Cognitive Functioning Concentration: Decreased Memory: Recent Intact;Remote Intact IQ: Average Insight: Poor Impulse Control: Fair Appetite: Fair Weight Loss: 85 (Gastric bypass in 2010) Weight Gain: 0 Sleep: Increased Total Hours of Sleep:  (sleeps as much as she can) Vegetative Symptoms: Staying in bed;Not bathing;Decreased grooming  ADLScreening [facility] Assessment  Services) Patient's cognitive ability adequate to safely complete daily activities?: Yes Patient able to express need for assistance with ADLs?: Yes Independently performs ADLs?: Yes (appropriate for developmental age)  Prior Inpatient Therapy Prior Inpatient Therapy: No Prior Therapy Dates: na Prior Therapy Facilty/Provider(s): na Reason for Treatment: na  Prior Outpatient Therapy Prior Outpatient Therapy: Yes Prior Therapy Dates: 2010 - current and 2013 Prior Therapy Facilty/Provider(s): Dr. Evelene CroonKaur - psychiatrist - current;  Schuyler Amoraryl Hines - therapist Reason for Treatment: Depression  ADL Screening (condition at time of admission) Patient's cognitive ability adequate to safely complete daily activities?: Yes Is the patient deaf or have difficulty hearing?: No Does the patient have difficulty seeing, even when wearing glasses/contacts?: No Does the patient have difficulty concentrating, remembering, or making decisions?: No Patient able to express need for assistance with ADLs?: Yes Does the patient have difficulty dressing or bathing?: No Independently performs ADLs?: Yes (appropriate for developmental age) Does the patient have difficulty walking or climbing stairs?: No  Home Assistive Devices/Equipment Home Assistive Devices/Equipment: None    Abuse/Neglect Assessment (Assessment to be complete while patient is alone) Physical Abuse: Denies Verbal Abuse: Denies Sexual Abuse: Denies Exploitation of patient/patient's resources: Denies Self-Neglect: Denies Values / Beliefs Cultural Requests During Hospitalization: None Spiritual Requests During Hospitalization: None Consults Spiritual Care Consult Needed: No Social Work Consult Needed: No Merchant navy officerAdvance Directives (For Healthcare) Advance Directive: Patient does not have advance directive;Patient would not like information    Additional Information 1:1 In Past 12 Months?: No CIRT Risk: No Elopement Risk: No Does patient have  medical clearance?: No     Disposition:  Disposition Initial Assessment Completed for this Encounter: Yes Disposition of Patient: Inpatient treatment program [facility]  On Site Evaluation by:   Reviewed with Physician:    Caryl ComesButler, Karsynn Deweese Kristen 09/23/2013 6:01 PM

## 2013-09-24 ENCOUNTER — Encounter (HOSPITAL_COMMUNITY): Payer: Self-pay | Admitting: Psychiatry

## 2013-09-24 DIAGNOSIS — F411 Generalized anxiety disorder: Secondary | ICD-10-CM

## 2013-09-24 LAB — CBC
HCT: 36 % (ref 36.0–46.0)
Hemoglobin: 11.6 g/dL — ABNORMAL LOW (ref 12.0–15.0)
MCH: 24.8 pg — ABNORMAL LOW (ref 26.0–34.0)
MCHC: 32.2 g/dL (ref 30.0–36.0)
MCV: 77.1 fL — ABNORMAL LOW (ref 78.0–100.0)
PLATELETS: 309 10*3/uL (ref 150–400)
RBC: 4.67 MIL/uL (ref 3.87–5.11)
RDW: 14.9 % (ref 11.5–15.5)
WBC: 11.4 10*3/uL — AB (ref 4.0–10.5)

## 2013-09-24 LAB — COMPREHENSIVE METABOLIC PANEL
ALBUMIN: 4.1 g/dL (ref 3.5–5.2)
ALK PHOS: 125 U/L — AB (ref 39–117)
ALT: 52 U/L — ABNORMAL HIGH (ref 0–35)
AST: 27 U/L (ref 0–37)
BILIRUBIN TOTAL: 0.3 mg/dL (ref 0.3–1.2)
BUN: 9 mg/dL (ref 6–23)
CHLORIDE: 102 meq/L (ref 96–112)
CO2: 23 mEq/L (ref 19–32)
Calcium: 9.5 mg/dL (ref 8.4–10.5)
Creatinine, Ser: 0.73 mg/dL (ref 0.50–1.10)
GFR calc Af Amer: >90 mL/min (ref >90)
GFR calc non Af Amer: >90 mL/min (ref >90)
Glucose, Bld: 97 mg/dL (ref 70–99)
POTASSIUM: 4 meq/L (ref 3.7–5.3)
SODIUM: 140 meq/L (ref 137–147)
TOTAL PROTEIN: 7.3 g/dL (ref 6.0–8.3)

## 2013-09-24 LAB — ETHANOL: Alcohol, Ethyl (B): <11 mg/dL (ref 0–11)

## 2013-09-24 MED ORDER — CLONAZEPAM 1 MG PO TABS
1.0000 mg | ORAL_TABLET | Freq: Two times a day (BID) | ORAL | Status: DC
  Administered 2013-09-24 – 2013-09-28 (×9): 1 mg via ORAL
  Filled 2013-09-24 (×8): qty 1

## 2013-09-24 MED ORDER — LAMOTRIGINE 100 MG PO TABS
100.0000 mg | ORAL_TABLET | Freq: Every day | ORAL | Status: DC
Start: 2013-09-24 — End: 2013-09-24
  Administered 2013-09-24: 100 mg via ORAL
  Filled 2013-09-24 (×2): qty 1

## 2013-09-24 MED ORDER — CLONAZEPAM 0.5 MG PO TABS
ORAL_TABLET | ORAL | Status: AC
  Filled 2013-09-24: qty 1

## 2013-09-24 MED ORDER — CLONAZEPAM 1 MG PO TABS
ORAL_TABLET | ORAL | Status: AC
  Administered 2013-09-24: 13:00:00
  Filled 2013-09-24: qty 1

## 2013-09-24 MED ORDER — DULOXETINE HCL 60 MG PO CPEP
60.0000 mg | ORAL_CAPSULE | Freq: Every day | ORAL | Status: DC
  Administered 2013-09-24 – 2013-09-28 (×5): 60 mg via ORAL
  Filled 2013-09-24: qty 4
  Filled 2013-09-24 (×6): qty 1

## 2013-09-24 MED ORDER — LAMOTRIGINE 25 MG PO TABS: 75.0000 mg | ORAL_TABLET | Freq: Every day | ORAL | Status: DC

## 2013-09-24 MED ORDER — LAMOTRIGINE 25 MG PO TABS
50.0000 mg | ORAL_TABLET | Freq: Every day | ORAL | Status: DC
  Administered 2013-09-25 – 2013-09-28 (×4): 50 mg via ORAL
  Filled 2013-09-24: qty 2
  Filled 2013-09-24: qty 8
  Filled 2013-09-24 (×4): qty 2

## 2013-09-24 MED ORDER — CALCIUM CARBONATE 1250 (500 CA) MG PO TABS
1.0000 | ORAL_TABLET | Freq: Two times a day (BID) | ORAL | Status: DC
  Administered 2013-09-24 – 2013-09-28 (×9): 500 mg via ORAL
  Filled 2013-09-24 (×13): qty 1

## 2013-09-24 MED ORDER — ADULT MULTIVITAMIN W/MINERALS CH
1.0000 | ORAL_TABLET | Freq: Three times a day (TID) | ORAL | Status: DC
  Administered 2013-09-24 – 2013-09-26 (×9): 1 via ORAL
  Filled 2013-09-24 (×13): qty 1

## 2013-09-24 MED ORDER — FERROUS SULFATE 325 (65 FE) MG PO TABS
325.0000 mg | ORAL_TABLET | Freq: Three times a day (TID) | ORAL | Status: DC
  Administered 2013-09-24 – 2013-09-26 (×9): 325 mg via ORAL
  Filled 2013-09-24 (×14): qty 1

## 2013-09-24 MED ORDER — VITAMIN D3 25 MCG (1000 UNIT) PO TABS
4000.0000 [IU] | ORAL_TABLET | Freq: Every day | ORAL | Status: DC
  Administered 2013-09-24 – 2013-09-28 (×5): 4000 [IU] via ORAL
  Filled 2013-09-24 (×7): qty 4

## 2013-09-24 MED ORDER — ARIPIPRAZOLE 2 MG PO TABS
2.0000 mg | ORAL_TABLET | Freq: Every day | ORAL | Status: DC
  Administered 2013-09-24 – 2013-09-28 (×5): 2 mg via ORAL
  Filled 2013-09-24: qty 1
  Filled 2013-09-24: qty 4
  Filled 2013-09-24 (×5): qty 1

## 2013-09-24 NOTE — Progress Notes (Signed)
D Corrie DandyMary is OOB UAL on the 500 hall today...she is somewhat hypomanic, with pressured speech. Hyperactivity. Preoccupation with illness and meds.   A She takes her meds as ordered.She denies SI within the past 24 hrs and rates her depression and hopelessness "3/3" and says her DC plan is to " take meds, relax and unwind".   R Safety is in place and poc moves forward.

## 2013-09-24 NOTE — Progress Notes (Signed)
D:  Pt passive SI- contracts for safety.Pt denies HI/AV. Pt is pleasant and cooperative. Pt beginning to become anxious, and appears to be getting a little hypo-manic.  A: Pt was offered support and encouragement. Pt was given scheduled medications. Pt was encourage to attend groups. Q 15 minute checks were done for safety.   R:Pt attends groups and interacts well with peers and staff. Pt is taking medication. Pt has no complaints at this time.Pt receptive to treatment and safety maintained on unit.
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
## 2013-09-24 NOTE — [facility] Group Notes (Signed)
[facility] Group Notes:  (Clinical Social Work)  09/24/2013   3:00-4:00PM  Summary of Progress/Problems:   The main focus of today's process group was for the patient to identify ways in which they have sabotaged their own mental health wellness/recovery.  Motivational interviewing was used to explore the reasons they engage in this behavior, and reasons they may have for wanting to change.  The Stages of Change were explained to the group using a handout, and patients identified where they are with regard to changing self-defeating behaviors.  The patient expressed that she self-sabotages with perfectionism and procrastination, and stated she has never before admitted that she uses promiscuity but felt comfortable in the group to share this.  Type of Therapy:  Process Group  Participation Level:  Active  Participation Quality:  Attentive, Sharing and Supportive  Affect:  Anxious  Cognitive:  Appropriate and Oriented  Insight:  Engaged  Engagement in Therapy:  Engaged  Modes of Intervention:  Education, Motivational Interviewing   Cindy MantleMareida Grossman-Orr, LCSW 09/24/2013, 4:00pm

## 2013-09-24 NOTE — Progress Notes (Signed)
Adult Psychoeducational Group Note  Date:  09/24/2013 Time:  10:08 PM  Group Topic/Focus:  Wrap-Up Group:   The focus of this group is to help patients review their daily goal of treatment and discuss progress on daily workbooks.  Participation Level:  Active  Participation Quality:  Attentive  Affect:  Appropriate  Cognitive:  Alert  Insight: Appropriate  Engagement in Group:  Engaged  Modes of Intervention:  Discussion  Additional Comments:  Patient says that she had a good day today. Pt states that her goal is to look inside herself a little more, says that she tries to stay in the moment.  Percell LocusJOHNSON,TAWANA 09/24/2013, 10:08 PM

## 2013-09-24 NOTE — Progress Notes (Signed)
.  Psychoeducational Group Note    Date: 09/24/2013 Time:  0930    Goal Setting Purpose of Group: To be able to set [name] goal that is measurable and that can be accomplished in one day Participation Level:  Active  Participation Quality:  Appropriate  Affect:  Appropriate  Cognitive:  Oriented  Insight:  Improving  Engagement in Group:  Engaged  Additional Comments:  Engaged and participated in the group  Cindy [name] 

## 2013-09-24 NOTE — Progress Notes (Signed)
Psychoeducational Group Note  Date: 09/24/2013 Time:  1015  Group Topic/Focus:  Identifying Needs:   The focus of this group is to help patients identify their personal needs that have been historically problematic and identify healthy behaviors to address their needs.  Participation Level:  Active  Participation Quality:  Appropriate  Affect:  Appropriate  Cognitive:  Oriented  Insight:  Engaged  Engagement in Group:  Engaged  Additional Comments:  Pt participated and is engaged in the group  Theadore Blunck A 

## 2013-09-24 NOTE — H&P (Signed)
Psychiatric Admission Assessment Adult  Patient Identification:  Cindy [name] Date of Evaluation:  09/24/2013 Chief Complaint:  major depression History of Present Illness:  28 y.o. female that was referred by her psychiatrist, Dr. Toy Care, as a walk-in to [facility]. Pt presents with her parents reporting SI with a plan to overdose. Pt stated she has been having thoughts of suicide, "or not wanting to be here," and "having no purpose in life," for approximately 4 months. Pt reports she has had depression for many years, but has not felt this way before. Pt stated Dr. Toy Care recently changed her medications, as she felt her current regimen wasn't helping as well as it used to. Pt stated she has been afraid that she will develop Bipolar Disorder like her mother has. Pt stated Dr. Toy Care recently added an unknown medication for Bipolar Disorder to her medication regimen. She has also recently gone back on Xanax after being off for 3 months by report and has also increased her dosage of Cymbalta to 90 mg. Pt also takes Trazadone sporadically for sleep if needed by report. Pt reported she has been breaking the capsules of Cymbalta to swallow them recently because of them not being absorbed as well since having her gastric bypass surgery in 2010. Pt stated she has lost 85 lbs. Pt reported she has daily panic attacks and takes Xanax so she can rest and sleep, and sleeps as much as she can by report. Other neurovegetative sx of depression that the pt presents with are decreased grooming and hygiene. Pt cried through most of the session and asked for help. She has had no previous suicide attempts. She has had no previous inpt treatment, only outpt treatment since her divorce. 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 Pt was pleasant and cooperative during assessment and agrees that she needs inpt treatment. Pt denies the use of alcohol or drugs. She denies HI or psychosis. Pt's parents are also supportive. Consulted with Letitia Libra, [facility], and Dr. Louretta Shorten, who accepted pt to [facility] to [id number] @ [phone]. Pt admitted to [facility]. TTS staff notified.  Elements:  Location:  generalized. Quality:  acute. Severity:  severe. Timing:  past two weeks. Duration:  constant. Context:  stressors. Associated Signs/Synptoms: Depression Symptoms:  depressed mood, suicidal thoughts with specific plan, (Hypo) Manic Symptoms:  None Anxiety Symptoms:  Excessive Worry, Psychotic Symptoms:  None PTSD Symptoms:  None  Psychiatric Specialty Exam: Physical Exam  Constitutional: She is oriented to person, place, and time. She appears well-developed and well-nourished.  HENT:  Head: Normocephalic and atraumatic.  Neck: Normal range of motion.  Respiratory: Effort normal.  Genitourinary:  IUD, light menstruation  Musculoskeletal: Normal range of motion.  Neurological: She is alert and oriented to person, place, and time.  Skin: Skin is warm and dry.    Review of Systems  Constitutional: Negative.   HENT: Negative.   Eyes: Negative.   Respiratory: Negative.   Cardiovascular: Negative.   Gastrointestinal: Negative.   Genitourinary: Negative.   Musculoskeletal: Negative.   Skin: Negative.   Neurological: Negative.   Endo/Heme/Allergies: Negative.   Psychiatric/Behavioral: Positive for depression and suicidal ideas. The patient is nervous/anxious.     Blood pressure 144/90, pulse 72, temperature 97.9 F (36.6  C), temperature source Oral, resp. rate 16, height $RemoveBe'5\' 10"'YhMWhJWYV$  (1.778 m), weight 86.183 kg (190 lb).Body mass index is 27.26 kg/(m^2).  General Appearance: Casual  Eye Contact::  Fair  Speech:  Normal Rate  Volume:  Normal  Mood:  Anxious and Depressed  Affect:  Congruent  Thought Process:  Coherent   Orientation:  Full (Time, Place, and Person)  Thought Content:  WDL  Suicidal Thoughts:  Yes.  with intent/plan  Homicidal Thoughts:  Yes.  with intent/plan  Memory:  Immediate;   Fair Recent;   Fair Remote;   Fair  Judgement:  Fair  Insight:  Fair  Psychomotor Activity:  Normal  Concentration:  Fair  Recall:  Fair  Akathisia:  No  Handed:  Right  AIMS (if indicated):     Assets:  Leisure Time Physical Health Resilience Social Support  Sleep:  Number of Hours: 4.5    Past Psychiatric History: Diagnosis: Depression, anxiety  Hospitalizations:  None  Outpatient Care:  Dr Toy Care  Substance Abuse Care:  Benzodiazepine dependency  Self-Mutilation:  None  Suicidal Attempts:  None  Violent Behaviors:  None   Past Medical History:   Past Medical History  Diagnosis Date  . Obesity   . Depression   . History of chicken pox     around age 64  . History of colonic diverticulitis     once at age 55  . History of migraine   . History of hay fever   . History of high blood pressure   . History of UTI   . History of heartburn     and GERD sometimes    None. Allergies:   Allergies  Allergen Reactions  . Cefaclor     REACTION: Hives   PTA Medications: Prescriptions prior to admission  Medication Sig Dispense Refill  . ALPRAZolam (XANAX) 1 MG tablet Take 1 mg by mouth 6 (six) times daily.      Marland Kitchen alum & mag hydroxide-simeth (MAALOX PLUS) 400-400-40 MG/5ML suspension Take 15 mLs by mouth every 6 (six) hours as needed for indigestion.      . calcium carbonate (OS-CAL - DOSED IN MG OF ELEMENTAL CALCIUM) 1250 MG tablet Take 1 tablet by mouth 2 (two) times daily with a meal.      . cetirizine (ZYRTEC) 10 MG tablet Take 10 mg by mouth daily.      . cholecalciferol (VITAMIN D) 1000 UNITS tablet Take 4,000 Units by mouth daily.      . DULoxetine (CYMBALTA) 30 MG capsule Take 90 mg by mouth daily.      . ferrous sulfate 325 (65 FE) MG tablet Take 325 mg by mouth 3 (three) times  daily with meals.      . Multiple Vitamin (MULTIVITAMIN WITH MINERALS) TABS Take 1 tablet by mouth 3 (three) times daily.      . ondansetron (ZOFRAN ODT) 4 MG disintegrating tablet Take 1 tablet (4 mg total) by mouth every 6 (six) hours as needed for nausea.  10 tablet  0  . traZODone (DESYREL) 50 MG tablet Take 50-100 mg by mouth at bedtime as needed for sleep.        Previous Psychotropic Medications:  Medication/Dose    See above   Substance Abuse History in the last 12 months:  no  Consequences of Substance Abuse: NA  Social History:  reports that she has never smoked. She has never used smokeless tobacco. She reports that she does not drink alcohol or use illicit  drugs. Additional Social History: Pain Medications: see med list Prescriptions: see med list Over the Counter: see med list History of alcohol / drug use?: No history of alcohol / drug abuse Longest period of sobriety (when/how long):  (na) Negative Consequences of Use:  (na) Withdrawal Symptoms:  (na)   Current Place of Residence:   Place of Birth:   Family Members: Marital Status:  Divorced Children:  Sons:  Daughters: Relationships: Education:  Dentist Problems/Performance: Religious Beliefs/Practices: History of Abuse (Emotional/Phsycial/Sexual) Ship broker History:  None. Legal History: Hobbies/Interests:  Family History:   Family History  Problem Relation Age of Onset  . Schizophrenia Mother   . Bipolar disorder Mother   . Alcohol abuse Maternal Grandfather   . Arthritis Mother     in her back  . Heart disease Paternal Grandmother   . High blood pressure Father   . Diabetes Paternal Aunt     Results for orders placed during the hospital encounter of 09/23/13 (from the past 72 hour(s))  URINE RAPID DRUG SCREEN (HOSP PERFORMED)     Status: Abnormal   Collection Time    09/23/13  9:42 PM      Result Value Range   Opiates NONE DETECTED  NONE DETECTED    Cocaine NONE DETECTED  NONE DETECTED   Benzodiazepines POSITIVE (*) NONE DETECTED   Amphetamines NONE DETECTED  NONE DETECTED   Tetrahydrocannabinol NONE DETECTED  NONE DETECTED   Barbiturates NONE DETECTED  NONE DETECTED   Comment:            DRUG SCREEN FOR MEDICAL PURPOSES     ONLY.  IF CONFIRMATION IS NEEDED     FOR ANY PURPOSE, NOTIFY LAB     WITHIN 5 DAYS.                LOWEST DETECTABLE LIMITS     FOR URINE DRUG SCREEN     Drug Class       Cutoff (ng/mL)     Amphetamine      1000     Barbiturate      200     Benzodiazepine   754     Tricyclics       492     Opiates          300     Cocaine          300     THC              50     Performed at [facility], URINE     Status: None   Collection Time    09/23/13  9:42 PM      Result Value Range   Preg Test, Ur NEGATIVE  NEGATIVE   Comment:            THE SENSITIVITY OF THIS     METHODOLOGY IS >20 mIU/mL.     Performed at [facility]  CBC     Status: Abnormal   Collection Time    09/24/13  6:34 AM      Result Value Range   WBC 11.4 (*) 4.0 - 10.5 K/uL   RBC 4.67  3.87 - 5.11 MIL/uL   Hemoglobin 11.6 (*) 12.0 - 15.0 g/dL   HCT 36.0  36.0 - 46.0 %   MCV 77.1 (*) 78.0 - 100.0 fL   MCH 24.8 (*) 26.0 - 34.0 pg   MCHC 32.2  30.0 -  36.0 g/dL   RDW 14.9  11.5 - 15.5 %   Platelets 309  150 - 400 K/uL   Comment: [facility]     Status: Abnormal   Collection Time    09/24/13  6:34 AM      Result Value Range   Sodium 140  137 - 147 mEq/L   Comment: Please note change in reference range.   Potassium 4.0  3.7 - 5.3 mEq/L   Comment: Please note change in reference range.   Chloride 102  96 - 112 mEq/L   CO2 23  19 - 32 mEq/L   Glucose, Bld 97  70 - 99 mg/dL   BUN 9  6 - 23 mg/dL   Creatinine, Ser 0.73  0.50 - 1.10 mg/dL   Calcium 9.5  8.4 - 10.5 mg/dL   Total Protein 7.3  6.0 - 8.3 g/dL   Albumin 4.1  3.5 -  5.2 g/dL   AST 27  0 - 37 U/L   ALT 52 (*) 0 - 35 U/L   Alkaline Phosphatase 125 (*) 39 - 117 U/L   Total Bilirubin 0.3  0.3 - 1.2 mg/dL   GFR calc non Af Amer >90  >90 mL/min   GFR calc Af Amer >90  >90 mL/min   Comment: (NOTE)     The eGFR has been calculated using the CKD EPI equation.     This calculation has not been validated in all clinical situations.     eGFR's persistently <90 mL/min signify possible Chronic Kidney     Disease.     Performed at [facility]  ETHANOL     Status: None   Collection Time    09/24/13  6:34 AM      Result Value Range   Alcohol, Ethyl (B) <11  0 - 11 mg/dL   Comment:            LOWEST DETECTABLE LIMIT FOR     SERUM ALCOHOL IS 11 mg/dL     FOR MEDICAL PURPOSES ONLY     Performed at [facility]   Psychological Evaluations:  Assessment:   DSM5:  Depressive Disorders:  Major Depressive Disorder - Severe (296.23)  AXIS I:  Anxiety Disorder NOS and Major Depression, Recurrent severe; Benzodiazepine dependency AXIS II:  Deferred AXIS III:   Past Medical History  Diagnosis Date  . Obesity   . Depression   . History of chicken pox     around age 26  . History of colonic diverticulitis     once at age 27  . History of migraine   . History of hay fever   . History of high blood pressure   . History of UTI   . History of heartburn     and GERD sometimes    AXIS IV:  other psychosocial or environmental problems, problems related to social environment and problems with primary support group AXIS V:  41-50 serious symptoms  Treatment Plan/Recommendations:  Plan:  Review of chart, vital signs, medications, and notes. 1-Admit for crisis management and stabilization.  Estimated length of stay 5-7 days past his current stay of 1 2-Individual and group therapy encouraged 3-Medication management for depression, benzodiazepine withdrawal/detox and anxiety to reduce current symptoms to base line and improve the  patient's overall level of functioning:  Medications reviewed with the patient and she stated no untoward effects, Klonopin replaced Xanax--tapering off per patient's  request, Lamictal tapering per patient requests, Abilify 2 mg daily for MDD per Dr. Sabra Heck to boost Cymbalta (lowered to 60 mg from her self-induced 90 mg) 4-Coping skills for depression, substance abuse, and anxiety developing-- 5-Continue crisis stabilization and management 6-Address health issues--monitoring vital signs, stable  7-Treatment plan in progress to prevent relapse of depression, substance abuse, and anxiety 8-Psychosocial education regarding relapse prevention and self-care 9-Health care follow up as needed for any health concerns  10-Call for consult with hospitalist for additional specialty patient services as needed.  Treatment Plan Summary: Daily contact with patient to assess and evaluate symptoms and progress in treatment Medication management Supportive approach/coping skills CBT;mindfulness Optimize treatment with psychotropics Current Medications:  Current Facility-Administered Medications  Medication Dose Route Frequency Provider Last Rate Last Dose  . acetaminophen (TYLENOL) tablet 650 mg  650 mg Oral Q6H PRN Lurena Nida, NP   650 mg at 09/23/13 2158  . alum & mag hydroxide-simeth (MAALOX/MYLANTA) 200-200-20 MG/5ML suspension 30 mL  30 mL Oral Q4H PRN Lurena Nida, NP   30 mL at 09/24/13 0906  . calcium carbonate (OS-CAL - dosed in mg of elemental calcium) tablet 500 mg of elemental calcium  1 tablet Oral BID WC Lurena Nida, NP   500 mg of elemental calcium at 09/24/13 0809  . cholecalciferol (VITAMIN D) tablet 4,000 Units  4,000 Units Oral Daily Lurena Nida, NP   4,000 Units at 09/24/13 0809  . clonazePAM (KLONOPIN) tablet 1 mg  1 mg Oral BID Waylan Boga, NP      . DULoxetine (CYMBALTA) DR capsule 60 mg  60 mg Oral Daily Lurena Nida, NP   60 mg at 09/24/13 0809  . ferrous sulfate tablet 325 mg   325 mg Oral TID WC Lurena Nida, NP   325 mg at 09/24/13 0808  . [START ON 09/25/2013] lamoTRIgine (LAMICTAL) tablet 75 mg  75 mg Oral Daily Waylan Boga, NP      . magnesium hydroxide (MILK OF MAGNESIA) suspension 30 mL  30 mL Oral Daily PRN Lurena Nida, NP      . multivitamin with minerals tablet 1 tablet  1 tablet Oral TID Lurena Nida, NP   1 tablet at 09/24/13 0810  . traZODone (DESYREL) tablet 50 mg  50 mg Oral QHS PRN Lurena Nida, NP   50 mg at 09/24/13 0016    Observation Level/Precautions:  15 minute checks  Laboratory:  completed, reviewed, stable  Psychotherapy:  Individual and group therapy  Medications:  Benzodiazepine and Lamictal taper off, Cymbalta  Consultations:  None  Discharge Concerns:  None    Estimated LOS:  5-7 days  Other:     I certify that inpatient services furnished can reasonably be expected to improve the patient's condition.   Waylan Boga, [place] 1/3/201511:32 AM Personally evaluated the patient reviewed the physical exam and agree with the assessment and plan Geralyn Flash A. Sabra Heck, M.D.

## 2013-09-24 NOTE — Progress Notes (Signed)
[facility] Group Notes:  (Nursing/MHT/Case Management/Adjunct)  Date:  09/24/2013  Time:  12:06 AM  Type of Therapy:  Group Therapy  Participation Level:  Active  Participation Quality:  Appropriate  Affect:  Appropriate  Cognitive:  Appropriate  Insight:  Good  Engagement in Group:  Engaged  Modes of Intervention:  Socialization and Support  Summary of Progress/Problems: Pt. Participated and had good insight in group.  Pt. Stated would like to strengthen self confidence to prevent relapse.  Sondra ComeWilson, Daphanie Oquendo J 09/24/2013, 12:06 AM

## 2013-09-24 NOTE — [facility] Suicide Risk Assessment (Signed)
Suicide Risk Assessment  Admission Assessment     Nursing information obtained from:    Demographic factors:    Current Mental Status:    Loss Factors:    Historical Factors:    Risk Reduction Factors:     CLINICAL FACTORS:   Depression:   Anhedonia Insomnia Severe  COGNITIVE FEATURES THAT CONTRIBUTE TO RISK:  Polarized thinking Thought constriction (tunnel vision)    SUICIDE RISK:   Moderate:  Frequent suicidal ideation with limited intensity, and duration, some specificity in terms of plans, no associated intent, good self-control, limited dysphoria/symptomatology, some risk factors present, and identifiable protective factors, including available and accessible social support.  PLAN OF CARE: Supportive approach/coping skills/relapse prevention                              CBT;mindfulness                              Optimize treatment with psychotropics  I certify that inpatient services furnished can reasonably be expected to improve the patient's condition.  Navarro Nine A 09/24/2013, 5:44 PM

## 2013-09-25 MED ORDER — METHOCARBAMOL 500 MG PO TABS
500.0000 mg | ORAL_TABLET | Freq: Four times a day (QID) | ORAL | Status: DC | PRN
  Administered 2013-09-26: 500 mg via ORAL
  Filled 2013-09-25: qty 1

## 2013-09-25 NOTE — Progress Notes (Signed)
Psychoeducational Group Note  Date: 09/25/2013 Time: 1015  Group Topic/Focus:  Making Healthy Choices:   The focus of this group is to help patients identify negative/unhealthy choices they were using prior to admission and identify positive/healthier coping strategies to replace them upon discharge.  Participation Level:  Active  Participation Quality:  Appropriate  Affect:  Appropriate  Cognitive:  Alert  Insight:  Engaged  Engagement in Group:  Engaged  Additional Comments:    09/25/2013,7:36 PM Kensleigh Gates, Joie BimlerPatricia Lynn

## 2013-09-25 NOTE — Progress Notes (Signed)
Psychoeducational Group Note  Date: 09/25/2013 Time:  0930 Group Topic/Focus:  Gratefulness:  The focus of this group is to help patients identify what two things they are most grateful for in their lives. What helps ground them and to center them on their work to their recovery.  Participation Level:  Active  Participation Quality:  Appropriate  Affect:  Appropriate  Cognitive:  Oriented  Insight:  Improving  Engagement in Group:  Engaged  Additional Comments:  Pt. Was active in paying attention and participating.  Brylee Mcgreal A  

## 2013-09-25 NOTE — [facility] Group Notes (Signed)
[facility] Group Notes:  (Clinical Social Work)  09/25/2013  1:30-2:30  Summary of Progress/Problems:   The main focus of today's process group was to   identify the patient's current support system and decide on other supports that can be put in place.  The picture on workbook was used to discuss why additional supports are needed.  An emphasis was placed on using counselor, doctor, therapy groups, 12-step groups, and problem-specific support groups to expand supports.   There was also an extensive discussion about what constitutes a healthy support versus an unhealthy support.  The patient expressed full comprehension of the concepts presented, and agreed that there is a need to add more supports.  The patient stated the current supports in place are two friends who don't yet know what has happened, but will be supportive, as well as her parents' church and herself.  Type of Therapy:  Process Group with Motivational Interviewing  Participation Level:  Active  Participation Quality:  Attentive, Sharing and Supportive  Affect:  Appropriate  Cognitive:  Alert and Appropriate  Insight:  Engaged  Engagement in Therapy:  Engaged  Modes of Intervention:   Education, Support and Processing, Activity  Pilgrim's PrideMareida Grossman-Orr, LCSW 09/25/2013, 12:15pm

## 2013-09-25 NOTE — Progress Notes (Signed)
[facility] MD Progress Note  09/25/2013 2:25 PM Cindy [name]  MRN:  [id number] Subjective:  Patient complained of a headache but cannot take any NSAIDs because of her bypass surgery--Robaxin PRN ordered to relax muscles, Claritin for her allergies also ordered.  Cindy [name] slept well but is still drowsy, encouraged not to take her Trazodone with her Abilify.  5/10 depression with passive suicidal ideations, appetite poor.  Patient was sitting on her bed journaling---she does enjoy the groups and states she has learned new coping skills.  Lamictal taper continues per patient request. Diagnosis:   DSM5:  Depressive Disorders:  Major Depressive Disorder - Severe (296.23)  Axis I: Anxiety Disorder NOS and Major Depression, Recurrent severe Axis II: Deferred Axis III:  Past Medical History  Diagnosis Date  . Obesity   . Depression   . History of chicken pox     around age 6  . History of colonic diverticulitis     once at age 1  . History of migraine   . History of hay fever   . History of high blood pressure   . History of UTI   . History of heartburn     and GERD sometimes    Axis IV: other psychosocial or environmental problems, problems related to social environment and problems with primary support group Axis V: 41-50 serious symptoms  ADL's:  Intact  Sleep: Good  Appetite:  Poor  Suicidal Ideation:  Plan:  vague Intent:  none Means:  none Homicidal Ideation:  Denies  Psychiatric Specialty Exam: Review of Systems  Constitutional: Negative.   HENT: Negative.   Eyes: Negative.   Respiratory: Negative.   Cardiovascular: Negative.   Gastrointestinal: Negative.   Genitourinary: Negative.   Musculoskeletal: Negative.   Skin: Negative.   Neurological: Negative.   Endo/Heme/Allergies: Negative.   Psychiatric/Behavioral: Positive for depression and suicidal ideas. The patient is nervous/anxious.     Blood pressure 124/88, pulse 91, temperature 98.9 F (37.2 C), temperature source  Oral, resp. rate 17, height $RemoveBe'5\' 10"'uEPJxvgRC$  (1.778 m), weight 86.183 kg (190 lb).Body mass index is 27.26 kg/(m^2).  General Appearance: Casual  Eye Contact::  Good  Speech:  Normal Rate  Volume:  Normal  Mood:  Anxious and Depressed  Affect:  Congruent  Thought Process:  Coherent  Orientation:  Full (Time, Place, and Person)  Thought Content:  Rumination  Suicidal Thoughts:  Yes.  without intent/plan  Homicidal Thoughts:  No  Memory:  Immediate;   Fair Recent;   Fair Remote;   Fair  Judgement:  Poor  Insight:  Lacking  Psychomotor Activity:  Decreased  Concentration:  Fair  Recall:  Fair  Akathisia:  No  Handed:  Right  AIMS (if indicated):     Assets:  Leisure Time Physical Health Resilience Social Support  Sleep:  Number of Hours: 6.75   Current Medications: Current Facility-Administered Medications  Medication Dose Route Frequency Provider Last Rate Last Dose  . acetaminophen (TYLENOL) tablet 650 mg  650 mg Oral Q6H PRN Lurena Nida, NP   650 mg at 09/25/13 1318  . alum & mag hydroxide-simeth (MAALOX/MYLANTA) 200-200-20 MG/5ML suspension 30 mL  30 mL Oral Q4H PRN Lurena Nida, NP   30 mL at 09/24/13 0906  . ARIPiprazole (ABILIFY) tablet 2 mg  2 mg Oral Daily Waylan Boga, NP   2 mg at 09/25/13 5176  . calcium carbonate (OS-CAL - dosed in mg of elemental calcium) tablet 500 mg of elemental calcium  1 tablet  Oral BID WC Lurena Nida, NP   500 mg of elemental calcium at 09/25/13 0824  . cholecalciferol (VITAMIN D) tablet 4,000 Units  4,000 Units Oral Daily Lurena Nida, NP   4,000 Units at 09/25/13 0867  . clonazePAM (KLONOPIN) tablet 1 mg  1 mg Oral BID Waylan Boga, NP   1 mg at 09/25/13 0824  . DULoxetine (CYMBALTA) DR capsule 60 mg  60 mg Oral Daily Lurena Nida, NP   60 mg at 09/25/13 6195  . ferrous sulfate tablet 325 mg  325 mg Oral TID WC Lurena Nida, NP   325 mg at 09/25/13 0932  . lamoTRIgine (LAMICTAL) tablet 50 mg  50 mg Oral Daily Waylan Boga, NP   50 mg at  09/25/13 6712  . magnesium hydroxide (MILK OF MAGNESIA) suspension 30 mL  30 mL Oral Daily PRN Lurena Nida, NP      . methocarbamol (ROBAXIN) tablet 500 mg  500 mg Oral Q6H PRN Waylan Boga, NP      . multivitamin with minerals tablet 1 tablet  1 tablet Oral TID Lurena Nida, NP   1 tablet at 09/24/13 1732  . traZODone (DESYREL) tablet 50 mg  50 mg Oral QHS PRN Lurena Nida, NP   50 mg at 09/24/13 2133    Lab Results:  Results for orders placed during the hospital encounter of 09/23/13 (from the past 48 hour(s))  URINE RAPID DRUG SCREEN (HOSP PERFORMED)     Status: Abnormal   Collection Time    09/23/13  9:42 PM      Result Value Range   Opiates NONE DETECTED  NONE DETECTED   Cocaine NONE DETECTED  NONE DETECTED   Benzodiazepines POSITIVE (*) NONE DETECTED   Amphetamines NONE DETECTED  NONE DETECTED   Tetrahydrocannabinol NONE DETECTED  NONE DETECTED   Barbiturates NONE DETECTED  NONE DETECTED   Comment:            DRUG SCREEN FOR MEDICAL PURPOSES     ONLY.  IF CONFIRMATION IS NEEDED     FOR ANY PURPOSE, NOTIFY LAB     WITHIN 5 DAYS.                LOWEST DETECTABLE LIMITS     FOR URINE DRUG SCREEN     Drug Class       Cutoff (ng/mL)     Amphetamine      1000     Barbiturate      200     Benzodiazepine   458     Tricyclics       099     Opiates          300     Cocaine          300     THC              50     Performed at [place], URINE     Status: None   Collection Time    09/23/13  9:42 PM      Result Value Range   Preg Test, Ur NEGATIVE  NEGATIVE   Comment:            THE SENSITIVITY OF THIS     METHODOLOGY IS >20 mIU/mL.     Performed at [facility]  CBC     Status: Abnormal   Collection Time    09/24/13  6:34 AM      Result Value Range   WBC 11.4 (*) 4.0 - 10.5 K/uL   RBC 4.67  3.87 - 5.11 MIL/uL   Hemoglobin 11.6 (*) 12.0 - 15.0 g/dL   HCT 41.8  67.2 - 92.6 %   MCV 77.1 (*) 78.0 - 100.0 fL   MCH 24.8  (*) 26.0 - 34.0 pg   MCHC 32.2  30.0 - 36.0 g/dL   RDW 72.8  20.5 - 41.0 %   Platelets 309  150 - 400 K/uL   Comment: Performed at [facility]  COMPREHENSIVE METABOLIC PANEL     Status: Abnormal   Collection Time    09/24/13  6:34 AM      Result Value Range   Sodium 140  137 - 147 mEq/L   Comment: Please note change in reference range.   Potassium 4.0  3.7 - 5.3 mEq/L   Comment: Please note change in reference range.   Chloride 102  96 - 112 mEq/L   CO2 23  19 - 32 mEq/L   Glucose, Bld 97  70 - 99 mg/dL   BUN 9  6 - 23 mg/dL   Creatinine, Ser 9.27  0.50 - 1.10 mg/dL   Calcium 9.5  8.4 - 33.1 mg/dL   Total Protein 7.3  6.0 - 8.3 g/dL   Albumin 4.1  3.5 - 5.2 g/dL   AST 27  0 - 37 U/L   ALT 52 (*) 0 - 35 U/L   Alkaline Phosphatase 125 (*) 39 - 117 U/L   Total Bilirubin 0.3  0.3 - 1.2 mg/dL   GFR calc non Af Amer >90  >90 mL/min   GFR calc Af Amer >90  >90 mL/min   Comment: (NOTE)     The eGFR has been calculated using the CKD EPI equation.     This calculation has not been validated in all clinical situations.     eGFR's persistently <90 mL/min signify possible Chronic Kidney     Disease.     Performed at [facility]  ETHANOL     Status: None   Collection Time    09/24/13  6:34 AM      Result Value Range   Alcohol, Ethyl (B) <11  0 - 11 mg/dL   Comment:            LOWEST DETECTABLE LIMIT FOR     SERUM ALCOHOL IS 11 mg/dL     FOR MEDICAL PURPOSES ONLY     Performed at [facility]    Physical Findings: AIMS: Facial and Oral Movements Muscles of Facial Expression: None, normal Lips and Perioral Area: None, normal Jaw: None, normal Tongue: None, normal,Extremity Movements Upper (arms, wrists, hands, fingers): None, normal Lower (legs, knees, ankles, toes): None, normal, Trunk Movements Neck, shoulders, hips: None, normal, Overall Severity Severity of abnormal movements (highest score from questions above): None,  normal Incapacitation due to abnormal movements: None, normal Patient's awareness of abnormal movements (rate only patient's report): No Awareness, Dental Status Current problems with teeth and/or dentures?: No Does patient usually wear dentures?: No  CIWA:    COWS:     Treatment Plan Summary: Daily contact with patient to assess and evaluate symptoms and progress in treatment Medication management  Plan:  Review of chart, vital signs, medications, and notes. 1-Individual and group therapy 2-Medication management for depression and anxiety:  Medications reviewed with the patient and she stated she had a  headache but could not take any NSAIDs, Robaxin and Claritin ordered 3-Coping skills for depression, anxiety 4-Continue crisis stabilization and management 5-Address health issues--monitoring vital signs, stable 6-Treatment plan in progress to prevent relapse of depression and anxiety  Medical Decision Making Problem Points:  Established problem, stable/improving (1) and Review of psycho-social stressors (1) Data Points:  Review of new medications or change in dosage (2)  I certify that inpatient services furnished can reasonably be expected to improve the patient's condition.   Waylan Boga, [place]  09/25/2013, 2:25 PM Agree with assessment and plan Geralyn Flash A. Sabra Heck, M.D.

## 2013-09-25 NOTE — Progress Notes (Signed)
Cindy [name] is in better condition to day. Less hypomania observed. Her emotions are more regulated, she is more open and talkative and she is seen sitting in the dayroom...visiting with her peers more often and for longer periods of time today.    A She  Completes her AM self inventory and on it she writes she denies SI, she rates her depression and hopelessness "8/4" and she states her DC plan is to pay attention to what I ma eating and to go over my plan with my mom and dad.    R She is given robaxin 500 mg po q 6 hr prn pain today via MD. POC cont

## 2013-09-25 NOTE — Progress Notes (Signed)
Spoke with pt 1:1 who reports having a good day. Parents visited tonight. Affect is animated, bright. Mood anxious. Pt continues to complain of persistent headache of a 5/10 but feels the tylenol prn is adequate. Denies any other complaints. Supported, encouraged. Denies SI/HI/AVH and remains safe. Presently in evening group. Lawrence MarseillesFriedman, Jamirah Zelaya Eakes

## 2013-09-25 NOTE — Progress Notes (Signed)
[facility] Group Notes:  (Nursing/MHT/Case Management/Adjunct)  Date:  09/25/2013  Time:  8:00 p.m.   Type of Therapy:  Psychoeducational Skills  Participation Level:  Active  Participation Quality:  Appropriate  Affect:  Appropriate  Cognitive:  Appropriate  Insight:  Appropriate  Engagement in Group:  Engaged  Modes of Intervention:  Education  Summary of Progress/Problems: The patient shared in group that she had a good day due in large part to the groups that were held. She enjoyed the groups that were facilitated by the nurses since they challenged her and also helped with organizing some coping skills. In terms of the theme of the day, her support system will consist of her parents, friends, and her doctor.    Coryn Mosso S 09/25/2013, 9:22 PM

## 2013-09-25 NOTE — Progress Notes (Signed)
Pt up and visible in the milieu. Social, appropriate in behavior. Mood somewhat hypomanic, anxious. Speech pressured. Complaining of a headache and asking for tylenol. Also requests trazadone as it aided in her sleep last night. Medicated with both. Support and encouragement offered. While pt indicated passive SI earlier in the day she denies it now. No HI/AVH. On reassess, pt is almost asleep and states headache has gone away. Pt safe. Lawrence MarseillesFriedman, Joie Hipps Eakes

## 2013-09-26 MED ORDER — LORATADINE 10 MG PO TABS
10.0000 mg | ORAL_TABLET | Freq: Every day | ORAL | Status: DC | PRN
  Administered 2013-09-27 – 2013-09-28 (×2): 10 mg via ORAL
  Filled 2013-09-26 (×2): qty 1

## 2013-09-26 NOTE — Progress Notes (Signed)
Patient ID: Cindy FeinsteinMary E [name], female   DOB: 08/09/1986, 28 y.o.   MR[facility] [id number] [facility]  09/26/2013 11:00 AM Cindy FeinsteinMary E [name]  MRN:  [id number] Subjective:  As of 09/25/2013, Patient complained of a headache but cannot take any NSAIDs because of her bypass surgery--Robaxin PRN ordered to relax muscles, Claritin for her allergies also ordered.  Jonquil slept well but is still drowsy, encouraged not to take her Trazodone with her Abilify.  5/10 depression with passive suicidal ideations, appetite poor.  Patient was sitting on her bed journaling---she does enjoy the groups and states she has learned new coping skills.  Lamictal taper continues per patient request.  As of 09/26/2013, pt reports that her mood, depression, and anxiety are virtually unchanged from yesterday to today. Pt reports that her appetite has improved, that she is sleeping well, but is feeling overall very fatigued since the initiation of new medications (educated on medication initiation side effects). Pt denies SI, HI, and Psychosis. Reports a good support system awaiting outpatient including family and friends. Denies physical complaints aside from mild right ear pain. Pt states that Tylenol has been ineffective thus far. Pt requests Zyrtec for chronic allergies and states that Claritin has been ineffective (addressed).    Diagnosis:   DSM5:  Depressive Disorders:  Major Depressive Disorder - Severe (296.23)  Axis I: Anxiety Disorder NOS and Major Depression, Recurrent severe Axis II: Deferred Axis III:  Past Medical History  Diagnosis Date  . Obesity   . Depression   . History of chicken pox     around age 28  . History of colonic diverticulitis     once at age 89  . History of migraine   . History of hay fever   . History of high blood pressure   . History of UTI   . History of heartburn     and GERD sometimes    Axis IV: other psychosocial or environmental problems, problems related to social environment and  problems with primary support group Axis V: 41-50 serious symptoms  ADL's:  Intact  Sleep: Good  Appetite:  Good  Suicidal Ideation:  Plan:  Denies Intent:  Denies Means:  Denies Homicidal Ideation:  Denies  Psychiatric Specialty Exam: Review of Systems  Constitutional: Positive for malaise/fatigue (pt c/o fatigue with initiation of new pharmacologic therapy).  HENT: Negative.   Eyes: Negative.   Respiratory: Negative.   Cardiovascular: Negative.   Gastrointestinal: Negative.   Genitourinary: Negative.   Musculoskeletal: Negative.   Skin: Negative.   Neurological: Negative.   Endo/Heme/Allergies: Negative.   Psychiatric/Behavioral: Positive for depression. Negative for suicidal ideas (Denies ideation during this assessment). The patient is nervous/anxious.     Blood pressure 133/89, pulse 89, temperature 97.9 F (36.6 C), temperature source Oral, resp. rate 18, height 5\' 10"  (1.778 m), weight 86.183 kg (190 lb).Body mass index is 27.26 kg/(m^2).  General Appearance: Casual  Eye Contact::  Good  Speech:  Normal Rate  Volume:  Normal  Mood:  Anxious and Depressed  Affect:  Congruent  Thought Process:  Coherent  Orientation:  Full (Time, Place, and Person)  Thought Content:  WDL  Suicidal Thoughts:  No  Homicidal Thoughts:  No  Memory:  Immediate;   Good Recent;   Good Remote;   Good  Judgement:  Fair  Insight:  Lacking  Psychomotor Activity:  Normal  Concentration:  Good  Recall:  Good  Akathisia:  No  Handed:  Right  AIMS (if indicated):  Assets:  Communication Skills Desire for Improvement Resilience  Sleep:  8 hours   Current Medications: Current Facility-Administered Medications  Medication Dose Route Frequency Provider Last Rate Last Dose  . acetaminophen (TYLENOL) tablet 650 mg  650 mg Oral Q6H PRN Kristeen Mans, NP   650 mg at 09/25/13 2117  . alum & mag hydroxide-simeth (MAALOX/MYLANTA) 200-200-20 MG/5ML suspension 30 mL  30 mL Oral Q4H PRN  Kristeen Mans, NP   30 mL at 09/24/13 0906  . ARIPiprazole (ABILIFY) tablet 2 mg  2 mg Oral Daily Nanine Means, NP   2 mg at 09/26/13 0817  . calcium carbonate (OS-CAL - dosed in mg of elemental calcium) tablet 500 mg of elemental calcium  1 tablet Oral BID WC Kristeen Mans, NP   500 mg of elemental calcium at 09/26/13 0818  . cholecalciferol (VITAMIN D) tablet 4,000 Units  4,000 Units Oral Daily Kristeen Mans, NP   4,000 Units at 09/26/13 [phone]  . clonazePAM (KLONOPIN) tablet 1 mg  1 mg Oral BID Nanine Means, NP   1 mg at 09/26/13 0818  . DULoxetine (CYMBALTA) DR capsule 60 mg  60 mg Oral Daily Kristeen Mans, NP   60 mg at 09/26/13 0818  . ferrous sulfate tablet 325 mg  325 mg Oral TID WC Kristeen Mans, NP   325 mg at 09/26/13 9604  . lamoTRIgine (LAMICTAL) tablet 50 mg  50 mg Oral Daily Nanine Means, NP   50 mg at 09/26/13 0817  . magnesium hydroxide (MILK OF MAGNESIA) suspension 30 mL  30 mL Oral Daily PRN Kristeen Mans, NP      . methocarbamol (ROBAXIN) tablet 500 mg  500 mg Oral Q6H PRN Nanine Means, NP      . multivitamin with minerals tablet 1 tablet  1 tablet Oral TID Kristeen Mans, NP   1 tablet at 09/26/13 0818  . traZODone (DESYREL) tablet 50 mg  50 mg Oral QHS PRN Kristeen Mans, NP   50 mg at 09/25/13 2117    Lab Results:  No results found for this or any previous visit (from the past 48 hour(s)).  Physical Findings: AIMS: Facial and Oral Movements Muscles of Facial Expression: None, normal Lips and Perioral Area: None, normal Jaw: None, normal Tongue: None, normal,Extremity Movements Upper (arms, wrists, hands, fingers): None, normal Lower (legs, knees, ankles, toes): None, normal, Trunk Movements Neck, shoulders, hips: None, normal, Overall Severity Severity of abnormal movements (highest score from questions above): None, normal Incapacitation due to abnormal movements: None, normal Patient's awareness of abnormal movements (rate only patient's report): No Awareness, Dental  Status Current problems with teeth and/or dentures?: No Does patient usually wear dentures?: No  CIWA:    COWS:     Treatment Plan Summary: Daily contact with patient to assess and evaluate symptoms and progress in treatment Medication management  Plan:   Review of chart, vital signs, medications, and notes.  1-Individual and group therapy  2-Medication management for depression and anxiety: Medications reviewed with the patient and she stated no untoward effects. Pt states that claritin is infeffective; requesting Zyrtec as she uses this at home with good results. Pharmacy does not stock Zyrtec. Will keep Claritin on board for patient to use PRN if desired.  3-Coping skills for depression, anxiety  4-Continue crisis stabilization and management  5-Address health issues--monitoring vital signs, stable  6-Treatment plan in progress to prevent relapse of depression and anxiety  Medical Decision Making Problem  Points:  Established problem, stable/improving (1) and Review of psycho-social stressors (1) Data Points:  Review of new medications or change in dosage (2)  I certify that inpatient services furnished can reasonably be expected to improve the patient's condition.   Beau Fanny, FNP-BC 09/26/2013, 11:00 AM  Reviewed the information documented and agree with the treatment plan.  Jamill Wetmore,JANARDHAHA R. 09/26/2013 12:34 PM

## 2013-09-26 NOTE — Tx Team (Signed)
Interdisciplinary Treatment Plan Update   Date Reviewed:  09/26/2013  Time Reviewed:  8:27 AM  Progress in Treatment:   Attending groups: Yes Participating in groups: Yes Taking medication as prescribed: Yes  Tolerating medication: Yes Family/Significant other contact made: No, but will ask patient for consent for collateral contact Patient understands diagnosis: Yes  Discussing patient identified problems/goals with staff: Yes Medical problems stabilized or resolved: Yes Denies suicidal/homicidal ideation: Yes Patient has not harmed self or others: Yes  For review of initial/current patient goals, please see plan of care.  Estimated Length of Stay:    Reasons for Continued Hospitalization:  Anxiety Depression Medication stabilization   New Problems/Goals identified:    Discharge Plan or Barriers:   Home with outpatient follow up with Dr. Evelene CroonKaur  Additional Comments:  Cindy [name] is an 28 y.o. female that was referred by her psychiatrist, Dr. Evelene CroonKaur, [facility]. Pt presents with her parents reporting SI with a plan to overdose. Pt stated she has been having thoughts of suicide, "or not wanting to be here," and "having no purpose in life," for approximately 4 months. Pt reports she has had depression for many years, but has not felt this way before. Pt stated Dr. Evelene CroonKaur recently changed her medications, as she felt her current regimen wasn't helping as well as it used to  Attendees:  Patient:  09/26/2013 8:27 AM   Signature: Mervyn GayJ. Jonnalagadda, MD 09/26/2013 8:27 AM  Signature:   Claudette Headonrad Withrow, FNP  09/26/2013 8:27 AM  Signature:  09/26/2013 8:27 AM  Signature:Beverly Terrilee CroakKnight, RN 09/26/2013 8:27 AM  Signature:  Neill Loftarol Davis RN 09/26/2013 8:27 AM  Signature:  Juline PatchQuylle Georgeanne Frankland, LCSW 09/26/2013 8:27 AM  Signature:  Reyes Ivanhelsea Horton, LCSW 09/26/2013 8:27 AM  Signature:  Leisa LenzValerie Enoch, Care Coordinator [facility] 09/26/2013 8:27 AM  Signature:  Manuela SchwartzJennifer Pritchett, RN  09/26/2013 8:27 AM  Signature:  09/26/2013   8:27 AM  Signature:    09/26/2013  8:27 AM  Signature:   09/26/2013  8:27 AM    Scribe for Treatment Team:   Juline PatchQuylle Hiroki Wint,  09/26/2013 8:27 AM

## 2013-09-26 NOTE — [facility] Group Notes (Signed)
[facility]   09/26/2013 11:58 AM    Participation Quality:  Appropraite  Mood/Affect:  Appropriate  Depression Rating:  0  Anxiety Rating:  1  Thoughts of Suicide:  No  Will you contract for safety?   NA  Current AVH:  No  Plan for Discharge/Comments:  Patient attended discharge planning group and actively participated in group.  She advised of having outpatient follow up with Dr. Evelene CroonKaur.  CSW provided all participants with daily workbook.   Transportation Means: Patient has transportation.   Supports:  Patient has a support system.   Santez Woodcox, Joesph JulyQuylle Hairston

## 2013-09-26 NOTE — Progress Notes (Signed)
Patient ID: Cindy FeinsteinMary E Balsley, female   DOB: 05/11/1986, 28 y.o.   MRN: [id number] D- Patient reports she slept well and that her appetite is improving.  Her energy level is notmal and her ability to pay attention is good.  She is rating her depression at 4/10 and her hopelessness at 2/10.  She denies thoughts of self harm and she has been attending all groups.  A- supported patient.  R Patient has been smiling and interacting with peers and staff.  She plans to make a choice to love herself and the she knows she can choose how she reacts to people around her.

## 2013-09-26 NOTE — Progress Notes (Signed)
Recreation Therapy Notes  Date: 01.05.2015 Time: 3:00pm Location: [address] Dayroom   Group Topic: Wellness  Goal Area(s) Addresses:  Patient will define components of whole wellness. Patient will verbalize benefit of whole wellness.  Behavioral Response: Appropriate  Intervention: Mind Map.  Activity: As a group patients were asked to identify and define the eight components of whole wellness - Physical, Mental, Emotional, Social, Environmental, Intellectual, Leisure, and Spiritual. Individually patients were asked to identify ways they are investing in each component of whole wellness.   Education: Wellness, Building control surveyorDischarge Planning, Coping Skills  Education Outcome: Acknowledges understanding   Clinical Observations/Feedback: Patient actively engaged in group session, completing worksheet as requested. Patient participated in group discussion about the importance of balancing these components to maintain whole wellness.   Marykay Lexenise L Rendon Howell, LRT/CTRS  Jearl KlinefelterBlanchfield, Larence Thone L 09/26/2013 4:52 PM

## 2013-09-27 MED ORDER — FERROUS SULFATE 325 (65 FE) MG PO TABS
325.0000 mg | ORAL_TABLET | Freq: Three times a day (TID) | ORAL | Status: DC
  Administered 2013-09-27 – 2013-09-28 (×5): 325 mg via ORAL
  Filled 2013-09-27 (×10): qty 1

## 2013-09-27 MED ORDER — ADULT MULTIVITAMIN W/MINERALS CH
1.0000 | ORAL_TABLET | Freq: Three times a day (TID) | ORAL | Status: DC
  Administered 2013-09-27 – 2013-09-28 (×5): 1 via ORAL
  Filled 2013-09-27 (×10): qty 1

## 2013-09-27 NOTE — Progress Notes (Signed)
The focus of this group is to help patients review their daily goal of treatment and discuss progress on daily workbooks. Pt attended the evening group session and responded to all discussion prompts from the Writer. Pt shared that today was a good day on the account of the groups she attended. "I really feel like the social worker's groups helped me today." Pt also cited her time in the gym with her peers as another highlight. Pt requested socks and towels, which were given to her by the Writer following group. Pt's affect was appropriate and she frequently volunteered helpful comments to her peers.

## 2013-09-27 NOTE — Progress Notes (Signed)
Patient ID: Cindy [name], female   DOB: 08/04/1986, 28 y.o.   MRN: [id number] Patient ID: Cindy FeinsteinMary E [name], female   DOB: 12/27/1985, 28 y.o.   MRN: [id number] [facility]  09/27/2013 1:06 PM Cindy [name]  MRN:  [id number] Subjective:  Patient has been doing well without significant behavioral or emotional problems today and has been participating in unit activities without difficulties. Patient has been compliant with her medication management without side effects. Patient has symptoms of depression with passive suicidal ideations but contracts for safety while in the hospital.  Patient enjoying the groups and states she has learned new coping skills.   Diagnosis:   DSM5:  Depressive Disorders:  Major Depressive Disorder - Severe (296.23)  Axis I: Anxiety Disorder NOS and Major Depression, Recurrent severe Axis II: Deferred Axis III:  Past Medical History  Diagnosis Date  . Obesity   . Depression   . History of chicken pox     around age 89  . History of colonic diverticulitis     once at age 28  . History of migraine   . History of hay fever   . History of high blood pressure   . History of UTI   . History of heartburn     and GERD sometimes    Axis IV: other psychosocial or environmental problems, problems related to social environment and problems with primary support group Axis V: 41-50 serious symptoms  ADL's:  Intact  Sleep: Good  Appetite:  Good  Suicidal Ideation:  Plan:  Denies Intent:  Denies Means:  Denies Homicidal Ideation:  Denies  Psychiatric Specialty Exam: Review of Systems  Constitutional: Positive for malaise/fatigue (pt c/o fatigue with initiation of new pharmacologic therapy).  HENT: Negative.   Eyes: Negative.   Respiratory: Negative.   Cardiovascular: Negative.   Gastrointestinal: Negative.   Genitourinary: Negative.   Musculoskeletal: Negative.   Skin: Negative.   Neurological: Negative.   Endo/Heme/Allergies: Negative.    Psychiatric/Behavioral: Positive for depression. Negative for suicidal ideas (Denies ideation during this assessment). The patient is nervous/anxious.     Blood pressure 127/89, pulse 90, temperature 97.8 F (36.6 C), temperature source Oral, resp. rate 18, height 5\' 10"  (1.778 m), weight 86.183 kg (190 lb).Body mass index is 27.26 kg/(m^2).  General Appearance: Casual  Eye Contact::  Good  Speech:  Normal Rate  Volume:  Normal  Mood:  Anxious and Depressed  Affect:  Congruent  Thought Process:  Coherent  Orientation:  Full (Time, Place, and Person)  Thought Content:  WDL  Suicidal Thoughts:  No  Homicidal Thoughts:  No  Memory:  Immediate;   Good Recent;   Good Remote;   Good  Judgement:  Fair  Insight:  Lacking  Psychomotor Activity:  Normal  Concentration:  Good  Recall:  Good  Akathisia:  No  Handed:  Right  AIMS (if indicated):     Assets:  Communication Skills Desire for Improvement Resilience  Sleep:  8 hours   Current Medications: Current Facility-Administered Medications  Medication Dose Route Frequency Provider Last Rate Last Dose  . acetaminophen (TYLENOL) tablet 650 mg  650 mg Oral Q6H PRN Kristeen MansFran E Hobson, NP   650 mg at 09/26/13 2153  . alum & mag hydroxide-simeth (MAALOX/MYLANTA) 200-200-20 MG/5ML suspension 30 mL  30 mL Oral Q4H PRN Kristeen MansFran E Hobson, NP   30 mL at 09/26/13 2113  . ARIPiprazole (ABILIFY) tablet 2 mg  2 mg Oral Daily Nanine MeansJamison Lord, NP  2 mg at 09/27/13 0809  . calcium carbonate (OS-CAL - dosed in mg of elemental calcium) tablet 500 mg of elemental calcium  1 tablet Oral BID WC Kristeen Mans, NP   500 mg of elemental calcium at 09/27/13 0809  . cholecalciferol (VITAMIN D) tablet 4,000 Units  4,000 Units Oral Daily Kristeen Mans, NP   4,000 Units at 09/27/13 0809  . clonazePAM (KLONOPIN) tablet 1 mg  1 mg Oral BID Nanine Means, NP   1 mg at 09/27/13 0809  . DULoxetine (CYMBALTA) DR capsule 60 mg  60 mg Oral Daily Kristeen Mans, NP   60 mg at 09/27/13  0809  . ferrous sulfate tablet 325 mg  325 mg Oral TID PC Nehemiah Settle, MD   325 mg at 09/27/13 1303  . lamoTRIgine (LAMICTAL) tablet 50 mg  50 mg Oral Daily Nanine Means, NP   50 mg at 09/27/13 0809  . loratadine (CLARITIN) tablet 10 mg  10 mg Oral Daily PRN Beau Fanny, FNP   10 mg at 09/27/13 7846  . magnesium hydroxide (MILK OF MAGNESIA) suspension 30 mL  30 mL Oral Daily PRN Kristeen Mans, NP      . methocarbamol (ROBAXIN) tablet 500 mg  500 mg Oral Q6H PRN Nanine Means, NP   500 mg at 09/26/13 2332  . multivitamin with minerals tablet 1 tablet  1 tablet Oral TID PC Nehemiah Settle, MD   1 tablet at 09/27/13 1303  . traZODone (DESYREL) tablet 50 mg  50 mg Oral QHS PRN Kristeen Mans, NP   50 mg at 09/26/13 2153    Lab Results:  No results found for this or any previous visit (from the past 48 hour(s)).  Physical Findings: AIMS: Facial and Oral Movements Muscles of Facial Expression: None, normal Lips and Perioral Area: None, normal Jaw: None, normal Tongue: None, normal,Extremity Movements Upper (arms, wrists, hands, fingers): None, normal Lower (legs, knees, ankles, toes): None, normal, Trunk Movements Neck, shoulders, hips: None, normal, Overall Severity Severity of abnormal movements (highest score from questions above): None, normal Incapacitation due to abnormal movements: None, normal Patient's awareness of abnormal movements (rate only patient's report): No Awareness, Dental Status Current problems with teeth and/or dentures?: No Does patient usually wear dentures?: No  CIWA:    COWS:     Treatment Plan Summary: Daily contact with patient to assess and evaluate symptoms and progress in treatment Medication management  Plan:   Review of chart, vital signs, medications, and notes.  1-Individual and group therapy  2-Medication management for depression and anxiety: Medications reviewed with the patient and she stated no untoward effects.   3-Coping skills for depression, anxiety  4-Continue crisis stabilization and management  5-Address health issues--monitoring vital signs, stable  6-Treatment plan in progress to prevent relapse of depression and anxiety 7. Disposition plans are in progress and patient may be discharged tomorrow when she continued to show clinical improvement and contracts for safety.  Medical Decision Making Problem Points:  Established problem, stable/improving (1) and Review of psycho-social stressors (1) Data Points:  Review of new medications or change in dosage (2)  I certify that inpatient services furnished can reasonably be expected to improve the patient's condition.    Boyd Litaker,JANARDHAHA R. 09/27/2013 1:06 PM

## 2013-09-27 NOTE — Progress Notes (Signed)
Patient ID: Cindy FeinsteinMary E Bernat, female   DOB: 06/10/1986, 28 y.o.   MRN: [id number] D- Patient reports her sleep was fair and her appetite is improving.  Her energy level is high and her ability to pay attention is good.  She is rating her depression at 4/10 and her hopelessness at 3/10. She reports she has noticed a little confusion and a little silliness "but nothing serious".  She plans to make plans with her parents about where she will live.  She has been attending all groups and has participated.  A- Supported patient.  R- Patient is interacting with others and had a long session with the chaplain, Molli HazardMatthew

## 2013-09-27 NOTE — [facility] Group Notes (Signed)
[facility] LCSW Group Therapy  09/27/2013  1:15 PM   Type of Therapy:  Group Therapy  Participation Level:  Active  Participation Quality:  Attentive, Sharing and Supportive  Affect:  Calm  Cognitive:  Alert and Oriented  Insight:  Developing/Improving and Engaged  Engagement in Therapy:  Developing/Improving and Engaged  Modes of Intervention:  Clarification, Confrontation, Discussion, Education, Exploration, Limit-setting, Orientation, Problem-solving, Rapport Building, Dance movement psychotherapisteality Testing, Socialization and Support  Summary of Progress/Problems: The topic for group therapy was feelings about diagnosis.  Pt actively participated in group discussion on their past and current diagnosis and how they feel towards this.  Pt also identified how society and family members judge them, based on their diagnosis as well as stereotypes and stigmas.  Pt discussed having a mom growing up with bipolar disorder and schizophrenia and how she had to defend her mother's behaviors as a child, while losing herself in the process.  Pt actively participated and was engaged in group discussion, as well as supportive to peers.    Cindy IvanChelsea Horton, LCSW 09/27/2013 2:14 PM

## 2013-09-27 NOTE — [facility] Suicide Risk Assessment (Signed)
[facility] INPATIENT:  Family/Significant Other Suicide Prevention Education  Suicide Prevention Education:  Education Completed; Cindy [name] - father [phone]) ,  (name of family member/significant other) has been identified by the patient as the family member/significant other with whom the patient will be residing, and identified as the person(s) who will aid the patient in the event of a mental health crisis (suicidal ideations/suicide attempt).  With written consent from the patient, the family member/significant other has been provided the following suicide prevention education, prior to the and/or following the discharge of the patient.  The suicide prevention education provided includes the following:  Suicide risk factors  Suicide prevention and interventions  National Suicide Hotline telephone number  [facility] assessment telephone number  [facility] Assistance 911  [facility] and/or [facility] telephone number  Request made of family/significant other to:  Remove weapons (e.g., guns, rifles, knives), all items previously/currently identified as safety concern.  Father states that he will secure pt's firearm tonight  Remove drugs/medications (over-the-counter, prescriptions, illicit drugs), all items previously/currently identified as a safety concern.  The family member/significant other verbalizes understanding of the suicide prevention education information provided.  The family member/significant other agrees to remove the items of safety concern listed above.  Cindy [name], Cindy [name] 09/27/2013, 3:25 PM

## 2013-09-27 NOTE — [facility] Group Notes (Signed)
Adult Psychoeducational Group Note  Date:  09/27/2013 Time:  10:36 PM  Group Topic/Focus:  Wrap-Up Group:   The focus of this group is to help patients review their daily goal of treatment and discuss progress on daily workbooks.  Participation Level:  Minimal  Participation Quality:  Appropriate  Affect:  Appropriate  Cognitive:  Appropriate  Insight: Appropriate  Engagement in Group:  Poor  Modes of Intervention:  Discussion  Additional Comments:  Patient appeared apathetic but stated that her goals were to listen more in group and take notes.  Delia ChimesRufus, Darrian Grzelak L 09/27/2013, 10:36 PM

## 2013-09-27 NOTE — Progress Notes (Signed)
The focus of this group is to educate the patient on the purpose and policies of crisis stabilization and provide a format to answer questions about their admission.  The group details unit policies and expectations of patients while admitted.Patient attended and was an active participant in exercises and in talking about her recovery plan and goal setting.  She supported others as well

## 2013-09-27 NOTE — Progress Notes (Signed)
Recreation Therapy Notes  INPATIENT RECREATION THERAPY ASSESSMENT  Patient Stressors: Family, Relationship, Friends, Work,   Coping Skills: Isolate, Arguments, Substance Abuse, Avoidance,   Leisure Interests: Financial controllerArts & Crafts, AnimatorComputer (social media), Family Activities, Listening to Music, Shopping, Table Games, Engineer, structuralTravel,   Engineer, building servicesersonal Challenges: Communication, Concentration, Decision-Making,  Relationships, Social Interaction,  Substance Abuse, Time Management  Patient indicated she does not have a physical disability that would prevent participating in recreation therapy group sessions.  Patient listed the following strengths: Social at [place], Creativity to share with others.   Patient indicated she would like to change the following about herself: Self-esteem, Gaining friends, Networking for a career  Patient listed the following current recreation interests:  TV, Shopping       Patient goal for hospitalization: Feeling better about self & bad decisions I have had.   Marykay Lexenise L Saahil Herbster, LRT/CTRS   Duc Crocket L 09/27/2013 9:55 AM

## 2013-09-27 NOTE — Progress Notes (Signed)
Recreation Therapy Notes  Animal-Assisted Activity/Therapy (AAA/T) Program Checklist/Progress Notes Patient Eligibility Criteria Checklist & Daily Group note for Rec Tx Intervention  Date: 01.06.2015 Time: 2:45pm [address]    AAA/T Program Assumption of Risk Form signed by Patient/ or Parent Legal Guardian yes  Patient is free of allergies or sever asthma yes  Patient reports no fear of animals yes  Patient reports no history of cruelty to animals yes   Patient understands his/her participation is voluntary yes  Patient washes hands before animal contact yes  Patient washes hands after animal contact yes  Behavioral Response: Appropriate, Attentive  Education: Hand Washing, Appropriate Animal Interaction   Education Outcome: Acknowledges understanding   Clinical Observations/Feedback: Patient interacted appropriately with therapy dog team and peers. Patient brightened upon interacting with therapy dog.   Cindy [name], LRT/CTRS  Jearl KlinefelterBlanchfield, Sydney Hasten L 09/27/2013 4:43 PM

## 2013-09-27 NOTE — Progress Notes (Signed)
Pt. Is presently resting quietly/ resp even unlabored no signs of distress or discomfort noted.

## 2013-09-28 DIAGNOSIS — F332 Major depressive disorder, recurrent severe without psychotic features: Principal | ICD-10-CM

## 2013-09-28 MED ORDER — ARIPIPRAZOLE 2 MG PO TABS: 2.0000 mg | ORAL_TABLET | Freq: Every day | ORAL | Status: DC

## 2013-09-28 MED ORDER — METHOCARBAMOL 500 MG PO TABS: 500.0000 mg | ORAL_TABLET | Freq: Four times a day (QID) | ORAL | Status: DC | PRN

## 2013-09-28 MED ORDER — CLONAZEPAM 1 MG PO TABS: 1.0000 mg | ORAL_TABLET | Freq: Two times a day (BID) | ORAL | Status: DC

## 2013-09-28 MED ORDER — LAMOTRIGINE 25 MG PO TABS: 50.0000 mg | ORAL_TABLET | Freq: Every day | ORAL | Status: DC

## 2013-09-28 MED ORDER — DULOXETINE HCL 60 MG PO CPEP: 60.0000 mg | ORAL_CAPSULE | Freq: Every day | ORAL | Status: DC

## 2013-09-28 NOTE — Tx Team (Signed)
Interdisciplinary Treatment Plan Update   Date Reviewed:  09/28/2013  Time Reviewed:  9:32 AM  Progress in Treatment:   Attending groups: Yes Participating in groups: Yes Taking medication as prescribed: Yes  Tolerating medication: Yes Family/Significant other contact made: Yes, collateral contact made with family Patient understands diagnosis: Yes  Discussing patient identified problems/goals with staff: Yes Medical problems stabilized or resolved: Yes Denies suicidal/homicidal ideation: Yes Patient has not harmed self or others: Yes  For review of initial/current patient goals, please see plan of care.  Estimated Length of Stay:  Discharge today.  Reasons for Continued Hospitalization:   New Problems/Goals identified:    Discharge Plan or Barriers:   Home with outpatient follow up with Dr. Evelene CroonKaur  Additional Comments:   Attendees:  Patient:  09/28/2013 9:32 AM   Signature: Mervyn GayJ. Jonnalagadda, MD 09/28/2013 9:32 AM  Signature:   Claudette Headonrad Withrow, FNP  09/28/2013 9:32 AM  Signature:  09/28/2013 9:32 AM  Signature:  Harold Barbanonecia Byrd, RN 09/28/2013 9:32 AM  Signature:  Aloha GellKrista Dopson RN 09/28/2013 9:32 AM  Signature:  Juline PatchQuylle Montee Tallman, LCSW 09/28/2013 9:32 AM  Signature:  Reyes Ivanhelsea Horton, LCSW 09/28/2013 9:32 AM  Signature: Leighton ParodyBritney Tyson, RN  09/28/2013 9:32 AM  Signature:   09/28/2013 9:32 AM  Signature:  09/28/2013  9:32 AM  Signature:    09/28/2013  9:32 AM  Signature:   09/28/2013  9:32 AM    Scribe for Treatment Team:   Juline PatchQuylle Nami Strawder,  09/28/2013 9:32 AM

## 2013-09-28 NOTE — Progress Notes (Signed)
Recreation Therapy Notes  Date: 01.07.2014 Time: 2:45pm Location: [address] Day[address]   Group Topic: Communication, Team Building, Problem Solving  Goal Area(s) Addresses:  Patient will effectively work with peer towards shared goal.  Patient will identify skill used to make activity successful.  Patient will identify how skills used during activity can be used to reach post d/c goals.   Behavioral Response: Appropriate   Intervention: Problem Solving Activitiy  Activity: Life Boat. Patients were given a scenario about being on a sinking yacht. Patients were informed the yacht included 15 guest, 8 of which could be placed on the life boat, along with all group members. Individuals on guest list were of varying socioeconomic classes such as a Education officer, museumriest, Materials engineerresident Obama, MidwifeBus Driver, Tree surgeonTeacher and Chef.   Education: Pharmacist, communityocial Skills, Discharge Planning   Education Outcome: Acknowledges Education  Clinical Observations/Feedback: Patient actively engaged in group session, voicing her opinion and debating with peers appropriately. Patient identified qualities that guided her decision making, in addition to importance of skills used during group session. Patient related skills used during group session to building her support system post d/c. Patient additionally identified positive emotions associated with used group skills effectively and connected group skills used to alleviating symptoms of depression.     Marykay Lexenise L Beaux Wedemeyer, LRT/CTRS  Jearl KlinefelterBlanchfield, Christinna Sprung L 09/28/2013 5:24 PM

## 2013-09-28 NOTE — Progress Notes (Signed)
Patient ID: Cindy FeinsteinMary E [name], female   DOB: 09/09/1986, 28 y.o.   MRN: [id number]   Pt. denies SI/HI and A/V hallucinations. Patient rated her depression at 3/10 and her hopelessness at 1/10 today. Scheduled medications given to patient per physician's orders and PRN Claritin to help with allergies. Belongings returned to patient at time of discharge. Patient rates her pain at 1/10 today due to allergies. Discharge instructions and medications were reviewed with patient. Patient verbalized understanding of both medications and discharge instructions. Support and encouragement provided to the patient. Patient was receptive and cooperative today and during discharge process. Q15 minute safety checks were maintained until discharge.

## 2013-09-28 NOTE — Discharge Summary (Signed)
Physician Discharge Summary Note  Patient:  Cindy [name] is an 28 y.o., female MRN:  [id number] DOB:  09/19/1986 Patient phone:  [phone] (home)  Patient address:   [address] [place] [address],   Date of Admission:  09/23/2013 Date of Discharge: 09/28/2013  Reason for Admission:  Major Depression, Recurrent, Severe; Anxiety  Discharge Diagnoses: Active Problems:   Severe recurrent major depression without psychotic features   ANXIETY  Review of Systems  Constitutional: Negative.   HENT: Negative.   Eyes: Negative.   Respiratory: Negative.   Cardiovascular: Negative.   Gastrointestinal: Negative.   Genitourinary: Negative.   Musculoskeletal: Negative.   Skin: Negative.   Neurological: Negative.   Endo/Heme/Allergies: Negative.   Psychiatric/Behavioral: Negative.     DSM5: Depressive Disorders:  Major Depressive Disorder - Severe (296.23) Axis Diagnosis:   AXIS I:  Anxiety Disorder NOS and Major Depression, Recurrent severe AXIS II:  Deferred AXIS III:   Past Medical History  Diagnosis Date  . Obesity   . Depression   . History of chicken pox     around age 44  . History of colonic diverticulitis     once at age 77  . History of migraine   . History of hay fever   . History of high blood pressure   . History of UTI   . History of heartburn     and GERD sometimes    AXIS IV:  other psychosocial or environmental problems and problems related to social environment AXIS V:  61-70 mild symptoms  Level of Care:  OP  Hospital Course:   28 y.o. female that was referred by her psychiatrist, Dr. Evelene [name], as a walk-in to [facility] Pt presents with her parents reporting SI with a plan to overdose. Pt stated she has been having thoughts of suicide, "or not wanting to be here," and "having no purpose in life," for approximately 4 months. Pt reports she has had depression for many years, but has not felt this way before. Pt stated Dr. Evelene [name] recently changed her  medications, as she felt her current regimen wasn't helping as well as it used to. Pt stated she has been afraid that she will develop Bipolar Disorder like her mother has. Pt stated Dr. Evelene [name] recently added an unknown medication for Bipolar Disorder to her medication regimen. She has also recently gone back on Xanax after being off for 3 months by report and has also increased her dosage of Cymbalta to 90 mg. Pt also takes Trazadone sporadically for sleep if needed by report. Pt reported she has been breaking the capsules of Cymbalta to swallow them recently because of them not being absorbed as well since having her gastric bypass surgery in 2010. Pt stated she has lost 85 lbs.   Pt reported she has daily panic attacks and takes Xanax so she can rest and sleep, and sleeps as much as she can by report. Other neurovegetative sx of depression that the pt presents with are decreased grooming and hygiene. Pt cried through most of the initial session and asked for help. She has had no previous suicide attempts. She has had no previous inpt treatment, only outpt treatment since her divorce. 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 Pt was pleasant and cooperative during initial assessment and agrees that she needs inpt treatment. Pt denies the use of alcohol or drugs. She denies HI or psychosis. Pt's parents are also supportive.   During Hospitalization: Medications managed, psychoeducation, group and individual therapy. Pt currently denies SI, HI, and Psychosis.   Consults:  psychiatry  Significant Diagnostic Studies:  None  Discharge Vitals:   Blood pressure 136/94, pulse 92, temperature 98.7 F (37.1 C), temperature source Oral, resp. rate 18, height 5\' 10"  (1.778 m), weight  86.183 kg (190 lb). Body mass index is 27.26 kg/(m^2). Lab Results:   No results found for this or any previous visit (from the past 72 hour(s)).  Physical Findings: AIMS: Facial and Oral Movements Muscles of Facial Expression: None, normal Lips and Perioral Area: None, normal Jaw: None, normal Tongue: None, normal,Extremity Movements Upper (arms, wrists, hands, fingers): None, normal Lower (legs, knees, ankles, toes): None, normal, Trunk Movements Neck, shoulders, hips: None, normal, Overall Severity Severity of abnormal movements (highest score from questions above): None, normal Incapacitation due to abnormal movements: None, normal Patient's awareness of abnormal movements (rate only patient's report): No Awareness, Dental Status Current problems with teeth and/or dentures?: No Does patient usually wear dentures?: No  CIWA:    COWS:     Psychiatric Specialty Exam: See Psychiatric Specialty Exam and Suicide Risk Assessment completed by Attending Physician prior to discharge.  Discharge destination:  Home  Is patient on multiple antipsychotic therapies at discharge:  No   Has Patient had three or more failed trials of antipsychotic monotherapy by history:  No  Recommended Plan for Multiple Antipsychotic Therapies: NA     Medication List    STOP taking these medications       ALPRAZolam 1 MG tablet  Commonly known as:  XANAX      TAKE these medications     Indication   alum & mag hydroxide-simeth 400-400-40 MG/5ML suspension  Commonly known as:  MAALOX PLUS  Take 15 mLs by mouth every 6 (six) hours as needed for indigestion.      ARIPiprazole 2 MG tablet  Commonly known as:  ABILIFY  Take 1 tablet (2 mg total) by mouth daily.   Indication:  mood stabilization     calcium carbonate 1250 MG tablet  Commonly known as:  OS-CAL - dosed in mg of elemental calcium  Take 1 tablet by mouth 2 (two) times daily with a meal.      cetirizine 10 MG tablet  Commonly known  as:  ZYRTEC  Take 10 mg by mouth daily.      cholecalciferol 1000 UNITS tablet  Commonly known as:  VITAMIN D  Take 4,000 Units by mouth daily.      clonazePAM 1 MG tablet  Commonly known as:  KLONOPIN  Take 1 tablet (1 mg total) by mouth 2 (two) times daily.   Indication:  anxiety     DULoxetine 60 MG capsule  Commonly known as:  CYMBALTA  Take 1 capsule (60 mg total) by mouth daily.   Indication:  mood stabilization     ferrous sulfate 325 (65 FE) MG tablet  Take 325 mg by mouth 3 (three) times daily with meals.      lamoTRIgine 25 MG tablet  Commonly known as:  LAMICTAL  Take 2 tablets (50 mg total) by mouth daily.   Indication:  mood stabilization     methocarbamol 500 MG tablet  Commonly known as:  ROBAXIN  Take  1 tablet (500 mg total) by mouth every 6 (six) hours as needed for muscle spasms.   Indication:  Musculoskeletal Pain     multivitamin with minerals Tabs tablet  Take 1 tablet by mouth 3 (three) times daily.      ondansetron 4 MG disintegrating tablet  Commonly known as:  ZOFRAN ODT  Take 1 tablet (4 mg total) by mouth every 6 (six) hours as needed for nausea.      traZODone 50 MG tablet  Commonly known as:  DESYREL  Take 50-100 mg by mouth at bedtime as needed for sleep.            Follow-up Information   [facility]  On 09/30/2013. (Appointment scheduled at 4:00 pm with Domenica ReamerBarbara Fouesk for therapy.  Arrive 15 minutes early to complete paperwork.)    Contact information:   [address] [place], [place] [address] Phone: [phone] Fax: [phone]      [facility] On 10/07/2013. (Appointment scheduled at 8:00 am with Dr. [facility] for medication management)    Contact information:   [address] [place], [place] [address] Phone: [phone] Fax: [phone]      Follow-up recommendations:  Activity:  As Tolerated Diet:  Heart healthy with low sodium (continue to consult your  gastroenterologist for diet recommendations)  Comments:   Take all medications as prescribed. Keep all follow-up appointments as scheduled.  Do not consume alcohol or use illegal drugs while on prescription medications. Report any adverse effects from your medications to your primary care provider promptly.  In the event of recurrent symptoms or worsening symptoms, call 911, a crisis hotline, or go to the nearest emergency department for evaluation.   Total Discharge Time:  Greater than 30 minutes.  Signed: Beau FannyWithrow, John C, FNP-BC 09/28/2013, 10:38 AM  Patient was seen for a face-to-face psychiatric evaluation, suicide risk assessment, case discussed with a physician extender and made discharge plan. Reviewed the information documented and agree with the treatment plan.   Sherlynn Tourville,JANARDHAHA R. 10/01/2013 12:49 PM

## 2013-09-28 NOTE — Progress Notes (Signed)
Chaplain consulted at pt request.  Provided emotional support around grief related to divorce, loss of job, family stress.  Pt spoke with chaplain about seeking support from church community and feeling guilt / selfishness around investing in self and thinking about own growth.  Chaplain and pt spoke about place of self-care in christian tradition and provided justification for caring for one self and finding relationship that affirm this.  Explored this especially in relation to boundaries with pt's mother.   Pt also processed losing many of her childhood dreams during her marriage and spoke with chaplain about reclaiming parts of herself now.    Belva CromeStalnaker, Hanae Waiters Wayne MDiv

## 2013-09-28 NOTE — Progress Notes (Signed)
[facility] :  Will you be returning to the same living situation after discharge: Yes,  Patient discharging home with family. At discharge, do you have transportation home?:Yes,  Patient to arrange transportation. Do you have the ability to pay for your medications:Yes,  Patient is able to obtain medications.  Release of information consent forms completed and in the chart;  Patient's signature needed at discharge.  Patient to Follow up at: Follow-up Information   [facility]  On 09/30/2013. (Appointment scheduled at 4:00 pm with Domenica ReamerBarbara Fouesk for therapy.  Arrive 15 minutes early to complete paperwork.)    Contact information:   [address] [place], [place] [address] Phone: [phone] Fax: [phone]      [facility] On 10/07/2013. (Appointment scheduled at 8:00 am with Dr. [facility] for medication management)    Contact information:   [address] [place], [place] [address] Phone: [phone] Fax: [phone]      Patient denies SI/HI: Patient no longer endorsing SI/HI or other thoughts of self harm.  Safety Planning and Suicide Prevention discussed:  .Reviewed with all patients during discharge planning group   Gurjit Loconte, Joesph JulyQuylle Hairston 09/28/2013, 10:38 AM

## 2013-09-28 NOTE — [facility] Suicide Risk Assessment (Signed)
Suicide Risk Assessment  Discharge Assessment     Demographic Factors:  Adolescent or young adult, Caucasian and Low socioeconomic status  Mental Status Per Nursing Assessment::   On Admission:     Current Mental Status by Physician: Patient is calm and cooperative. Patient has normal psychomotor activity. Patient has a normal rate  rhythm and volume of speech. Patient has a good mood with appropriate a bright and full affect. Patient has denied suicidal/homicidal  ideation, intention or plan. Patient has no evidence of psychotic symptoms.   Loss Factors: Financial problems/change in socioeconomic status  Historical Factors: Prior suicide attempts, Family history of mental illness or substance abuse and Impulsivity  Risk Reduction Factors:   Sense of responsibility to family, Religious beliefs about death, Living with another person, especially a relative, Positive social support, Positive therapeutic relationship and Positive coping skills or problem solving skills  Continued Clinical Symptoms:  Bipolar Disorder:   Depressive phase Depression:   Recent sense of peace/wellbeing Previous Psychiatric Diagnoses and Treatments Medical Diagnoses and Treatments/Surgeries  Cognitive Features That Contribute To Risk:  Polarized thinking    Suicide Risk:  Minimal: No identifiable suicidal ideation.  Patients presenting with no risk factors but with morbid ruminations; may be classified as minimal risk based on the severity of the depressive symptoms  Discharge Diagnoses:   AXIS I:  Bipolar, Depressed, Generalized Anxiety Disorder and Major Depression, Recurrent severe AXIS II:  Deferred AXIS III:   Past Medical History  Diagnosis Date  . Obesity   . Depression   . History of chicken pox     around age 28  . History of colonic diverticulitis     once at age 28  . History of migraine   . History of hay fever   . History of high blood pressure   . History of UTI   . History of  heartburn     and GERD sometimes    AXIS IV:  economic problems, other psychosocial or environmental problems, problems related to social environment and problems with primary support group AXIS V:  61-70 mild symptoms  Plan Of Care/Follow-up recommendations:  Activity:  As tolerated Diet:  Regular  Is patient on multiple antipsychotic therapies at discharge:  No   Has Patient had three or more failed trials of antipsychotic monotherapy by history:  No  Recommended Plan for Multiple Antipsychotic Therapies: NA  Gedeon Brandow,JANARDHAHA R. 09/28/2013, 11:56 AM

## 2013-09-28 NOTE — [facility] Group Notes (Signed)
[facility] Note   09/28/2013 9:30 AM    Participation Quality:  Appropraite  Mood/Affect:  Appropriate  Depression Rating:  3  Anxiety Rating:  6  Thoughts of Suicide:  No  Will you contract for safety?   NA  Current AVH:  No  Plan for Discharge/Comments:  Patient attended discharge planning group and actively participated in group. Patient reports doing well and hopes to discharge today.  [facility].  CSW provided all participants with daily workbook.   Transportation Means: Patient has transportation.   Supports:  Patient has a support system.   Lavanda Nevels, Joesph JulyQuylle Hairston

## 2013-10-03 NOTE — Progress Notes (Signed)
Patient Discharge Instructions:  After Visit Summary (AVS):   Faxed to:  10/03/13 Discharge Summary Note:   Faxed to:  10/03/13 Psychiatric Admission Assessment Note:   Faxed to:  10/03/13 Suicide Risk Assessment - Discharge Assessment:   Faxed to:  10/03/13 Faxed/Sent to the Next Level Care provider:  10/03/13 [facility]axed to [facility] @ [phone]  Jerelene ReddenSheena E Papineau, 10/03/2013, 3:39 PM

## 2013-11-25 ENCOUNTER — Telehealth: Payer: Self-pay

## 2013-11-25 ENCOUNTER — Encounter: Payer: Self-pay | Admitting: Internal Medicine

## 2013-11-25 ENCOUNTER — Ambulatory Visit (INDEPENDENT_AMBULATORY_CARE_PROVIDER_SITE_OTHER): Payer: BC Managed Care – PPO | Admitting: Internal Medicine

## 2013-11-25 VITALS — BP 116/88 | HR 80 | Temp 98.2°F | Wt 193.0 lb

## 2013-11-25 DIAGNOSIS — J309 Allergic rhinitis, unspecified: Secondary | ICD-10-CM

## 2013-11-25 DIAGNOSIS — N760 Acute vaginitis: Secondary | ICD-10-CM

## 2013-11-25 DIAGNOSIS — A499 Bacterial infection, unspecified: Secondary | ICD-10-CM

## 2013-11-25 DIAGNOSIS — B9689 Other specified bacterial agents as the cause of diseases classified elsewhere: Secondary | ICD-10-CM

## 2013-11-25 DIAGNOSIS — R300 Dysuria: Secondary | ICD-10-CM

## 2013-11-25 LAB — POCT URINALYSIS DIPSTICK
BILIRUBIN UA: NEGATIVE
Blood, UA: NEGATIVE
GLUCOSE UA: NEGATIVE
Ketones, UA: NEGATIVE
NITRITE UA: NEGATIVE
Protein, UA: NORMAL
Spec Grav, UA: 1.01
UROBILINOGEN UA: 0.2
pH, UA: 6

## 2013-11-25 MED ORDER — FLUTICASONE PROPIONATE 50 MCG/ACT NA SUSP: 2.0000 | Freq: Every day | NASAL | Status: DC

## 2013-11-25 MED ORDER — LEVOCETIRIZINE DIHYDROCHLORIDE 5 MG PO TABS: 5.0000 mg | ORAL_TABLET | Freq: Every evening | ORAL | Status: DC

## 2013-11-25 MED ORDER — METRONIDAZOLE 0.75 % VA GEL: 1.0000 | Freq: Two times a day (BID) | VAGINAL | Status: DC

## 2013-11-25 NOTE — Addendum Note (Signed)
Addended by: Roena MaladyEVONTENNO, Nadir Vasques Y on: 11/25/2013 01:44 PM   Modules accepted: Orders

## 2013-11-25 NOTE — Progress Notes (Signed)
Subjective:    Patient ID: Cindy [name], female    DOB: Feb 03, 1986, 28 y.o.   MRN: [id number]  HPI  Pt presents to the clinic today with c/o dysuria. She reports this started about 2 weeks ago. She was treated for BV 1 week ago with flagyl. She has had a UTI in the past and reports this feels the same. Additionally, she c/o cough, congestion and ear pain. This started 1 week ago. The cough is productive of clear phlegm. She is sneezing.  She has taken Mucinex, a zpack and diflucan. She finished this 1 week ago. She does have a history of seasonal allergies. She is on zyrtec daily.  Review of Systems      Past Medical History  Diagnosis Date  . Obesity   . Depression   . History of chicken pox     around age 27  . History of colonic diverticulitis     once at age 35  . History of migraine   . History of hay fever   . History of high blood pressure   . History of UTI   . History of heartburn     and GERD sometimes     Current Outpatient Prescriptions  Medication Sig Dispense Refill  . alum & mag hydroxide-simeth (MAALOX PLUS) 400-400-40 MG/5ML suspension Take 15 mLs by mouth every 6 (six) hours as needed for indigestion.      . ARIPiprazole (ABILIFY) 2 MG tablet Take 1 tablet (2 mg total) by mouth daily.  30 tablet  0  . calcium carbonate (OS-CAL - DOSED IN MG OF ELEMENTAL CALCIUM) 1250 MG tablet Take 1 tablet by mouth 2 (two) times daily with a meal.      . cetirizine (ZYRTEC) 10 MG tablet Take 10 mg by mouth daily.      . cholecalciferol (VITAMIN D) 1000 UNITS tablet Take 4,000 Units by mouth daily.      . clonazePAM (KLONOPIN) 1 MG tablet Take 1 tablet (1 mg total) by mouth 2 (two) times daily.  10 tablet  0  . DULoxetine (CYMBALTA) 60 MG capsule Take 1 capsule (60 mg total) by mouth daily.  30 capsule  0  . ferrous sulfate 325 (65 FE) MG tablet Take 325 mg by mouth 3 (three) times daily with meals.      . lamoTRIgine (LAMICTAL) 25 MG tablet Take 2 tablets (50 mg total) by  mouth daily.  60 tablet  0  . methocarbamol (ROBAXIN) 500 MG tablet Take 1 tablet (500 mg total) by mouth every 6 (six) hours as needed for muscle spasms.  30 tablet  0  . Multiple Vitamin (MULTIVITAMIN WITH MINERALS) TABS Take 1 tablet by mouth 3 (three) times daily.      . ondansetron (ZOFRAN ODT) 4 MG disintegrating tablet Take 1 tablet (4 mg total) by mouth every 6 (six) hours as needed for nausea.  10 tablet  0  . traZODone (DESYREL) 50 MG tablet Take 50-100 mg by mouth at bedtime as needed for sleep.       No current facility-administered medications for this visit.    Allergies  Allergen Reactions  . Cefaclor     REACTION: Hives    Family History  Problem Relation Age of Onset  . Schizophrenia Mother   . Bipolar disorder Mother   . Alcohol abuse Maternal Grandfather   . Arthritis Mother     in her back  . Heart disease Paternal Grandmother   .  High blood pressure Father   . Diabetes Paternal Aunt     History   Social History  . Marital Status: Divorced    Spouse Name: N/A    Number of Children: N/A  . Years of Education: N/A   Occupational History  . Not on file.   Social History Main Topics  . Smoking status: Never Smoker   . Smokeless tobacco: Never Used  . Alcohol Use: 0.0 oz/week     Comment: occ  . Drug Use: No  . Sexual Activity: Not on file   Other Topics Concern  . Not on file   Social History Narrative  . No narrative on file     Constitutional: Denies fever, malaise, fatigue, headache or abrupt weight changes.  HEENT: Pt reports ear pain. Denies eye pain, eye redness, ear pain, ringing in the ears, wax buildup, runny nose, nasal congestion, bloody nose, or sore throat. Respiratory: Pt reports cough. Denies difficulty breathing, shortness of breath.   GU: Pt reports dysuria. Denies urgency, frequency, burning sensation, blood in urine, odor or discharge.   No other specific complaints in a complete review of systems (except as listed in HPI  above).  Objective:   Physical Exam   BP 116/88  Pulse 80  Temp(Src) 98.2 F (36.8 C) (Oral)  Wt 193 lb (87.544 kg)  SpO2 99% Wt Readings from Last 3 Encounters:  11/25/13 193 lb (87.544 kg)  09/23/13 190 lb (86.183 kg)  04/27/13 181 lb 8 oz (82.328 kg)    General: Appears her stated age, well developed, well nourished in NAD. HEENT: Head: normal shape and size; Eyes: sclera white, no icterus, conjunctiva pink, PERRLA and EOMs intact; Ears: Tm's white and intact, normal light reflex; Nose: mucosa boggy and moist, septum midline; Throat/Mouth: Teeth present, mucosa pink and moist, + PND,  no exudate, lesions or ulcerations noted.  Cardiovascular: Normal rate and rhythm. S1,S2 noted.  No murmur, rubs or gallops noted. No JVD or BLE edema. No carotid bruits noted. Pulmonary/Chest: Normal effort and positive vesicular breath sounds. No respiratory distress. No wheezes, rales or ronchi noted.  Abdomen: Soft and nontender. Normal bowel sounds, no bruits noted. No distention or masses noted. Liver, spleen and kidneys non palpable. No CVA tenderness noted.   BMET    Component Value Date/Time   NA 140 09/24/2013 0634   K 4.0 09/24/2013 0634   CL 102 09/24/2013 0634   CO2 23 09/24/2013 0634   GLUCOSE 97 09/24/2013 0634   BUN 9 09/24/2013 0634   CREATININE 0.73 09/24/2013 0634   CALCIUM 9.5 09/24/2013 0634   GFRNONAA >90 09/24/2013 0634   GFRAA >90 09/24/2013 0634    Lipid Panel  No results found for this basename: chol, trig, hdl, cholhdl, vldl, ldlcalc    CBC    Component Value Date/Time   WBC 11.4* 09/24/2013 0634   RBC 4.67 09/24/2013 0634   HGB 11.6* 09/24/2013 0634   HCT 36.0 09/24/2013 0634   PLT 309 09/24/2013 0634   MCV 77.1* 09/24/2013 0634   MCH 24.8* 09/24/2013 0634   MCHC 32.2 09/24/2013 0634   RDW 14.9 09/24/2013 0634   LYMPHSABS 2.7 04/04/2013 0140   MONOABS 1.1* 04/04/2013 0140   EOSABS 0.2 04/04/2013 0140   BASOSABS 0.0 04/04/2013 0140    Hgb A1C No results found for this basename: HGBA1C          Assessment & Plan:   Allergic Rhinitis:  Will switch zyrtec to xyzal 5 mg daily  eRx for flonase as directed If no improvement in 7-10 days, RTC  Dysuria secondary to BV:  Urinalysis: mod leuks Will try metrogel- she failed oral flagyl  RTC as needed or if symptoms persist or worsen

## 2013-11-25 NOTE — Progress Notes (Signed)
Pre visit review using our clinic review tool, if applicable. No additional management support is needed unless otherwise documented below in the visit note. 

## 2013-11-25 NOTE — Patient Instructions (Addendum)
Allergic Rhinitis Allergic rhinitis is when the mucous membranes in the nose respond to allergens. Allergens are particles in the air that cause your body to have an allergic reaction. This causes you to release allergic antibodies. Through a chain of events, these eventually cause you to release histamine into the blood stream. Although meant to protect the body, it is this release of histamine that causes your discomfort, such as frequent sneezing, congestion, and an itchy, runny nose.  CAUSES  Seasonal allergic rhinitis (hay fever) is caused by pollen allergens that may come from grasses, trees, and weeds. Year-round allergic rhinitis (perennial allergic rhinitis) is caused by allergens such as house dust mites, pet dander, and mold spores.  SYMPTOMS   Nasal stuffiness (congestion).  Itchy, runny nose with sneezing and tearing of the eyes. DIAGNOSIS  Your health care provider can help you determine the allergen or allergens that trigger your symptoms. If you and your health care provider are unable to determine the allergen, skin or blood testing may be used. TREATMENT  Allergic Rhinitis does not have a cure, but it can be controlled by:  Medicines and allergy shots (immunotherapy).  Avoiding the allergen. Hay fever may often be treated with antihistamines in pill or nasal spray forms. Antihistamines block the effects of histamine. There are over-the-counter medicines that may help with nasal congestion and swelling around the eyes. Check with your health care provider before taking or giving this medicine.  If avoiding the allergen or the medicine prescribed do not work, there are many new medicines your health care provider can prescribe. Stronger medicine may be used if initial measures are ineffective. Desensitizing injections can be used if medicine and avoidance does not work. Desensitization is when a patient is given ongoing shots until the body becomes less sensitive to the allergen.  Make sure you follow up with your health care provider if problems continue. HOME CARE INSTRUCTIONS It is not possible to completely avoid allergens, but you can reduce your symptoms by taking steps to limit your exposure to them. It helps to know exactly what you are allergic to so that you can avoid your specific triggers. SEEK MEDICAL CARE IF:   You have a fever.  You develop a cough that does not stop easily (persistent).  You have shortness of breath.  You start wheezing.  Symptoms interfere with normal daily activities. Document Released: 06/03/2001 Document Revised: 06/29/2013 Document Reviewed: 05/16/2013 [employer] Patient Information 2014 [employer].  

## 2013-11-25 NOTE — Telephone Encounter (Signed)
Pt left v/m pt was seen earlier today and pt is coughing so much that she vomits after coughing episodes. Pt wants cough med.Please advise. [facility] [address].pt request cb.

## 2013-11-28 ENCOUNTER — Other Ambulatory Visit: Payer: Self-pay | Admitting: Internal Medicine

## 2013-11-28 MED ORDER — PROMETHAZINE-DM 6.25-15 MG/5ML PO SYRP: 5.0000 mL | ORAL_SOLUTION | Freq: Four times a day (QID) | ORAL | Status: DC | PRN

## 2013-11-28 NOTE — Telephone Encounter (Signed)
Pt is aware of Rx being sent to pharmacy--but pt states the metrogel only came with 5 applicators--pt wants to know if she is supposed to only apply for 2 1/2 days or more is needed--please advise

## 2013-11-28 NOTE — Telephone Encounter (Signed)
That would be a question for the pharmacy. She should be using it BID x 5 days

## 2013-11-28 NOTE — Telephone Encounter (Signed)
Will call in promethazine-dextromthorpahn cough syrup to her pharmacy

## 2013-11-29 NOTE — Telephone Encounter (Signed)
I called pharmacy and they confirmed that pt was given the standard 70g tube which is enough for BID x 5 days of application--pt is aware

## 2013-11-29 NOTE — Telephone Encounter (Signed)
Pt states she was given 5 applicators--i will call pharmacy to find out exactly what was given and for how many days it was supposed to be for

## 2014-01-13 ENCOUNTER — Telehealth: Payer: Self-pay | Admitting: Family Medicine

## 2014-01-13 NOTE — Telephone Encounter (Signed)
Pt is scheduled for a tummy tuck for 02/17/2014 but is going to have to r/s b/c her iron is too low.  She's been taking iron pills, but they are not working so she needs to get a referral for IV iron.  Will she need to come in first? Or can you place the referral? Please advise patient. Thank you!

## 2014-01-13 NOTE — Telephone Encounter (Signed)
Her last Hb here was 11.6-overall not too bad for anemia - has she had another one checked that is lower than that?

## 2014-01-16 ENCOUNTER — Ambulatory Visit: Payer: Self-pay | Admitting: Family Medicine

## 2014-01-16 DIAGNOSIS — Z0289 Encounter for other administrative examinations: Secondary | ICD-10-CM

## 2014-01-16 NOTE — Telephone Encounter (Signed)
Pt has an appt scheduled today to discuss her labs and she was going to bring a copy of her labs (per appt. Note). Pt no-showed this appt., call pt back and she didn't answer so left voicemail requesting her to call back and r/s that appt, since she is going to bring the labs that show her iron is low

## 2014-02-24 ENCOUNTER — Ambulatory Visit (INDEPENDENT_AMBULATORY_CARE_PROVIDER_SITE_OTHER): Payer: BC Managed Care – PPO | Admitting: Family Medicine

## 2014-02-24 ENCOUNTER — Telehealth: Payer: Self-pay

## 2014-02-24 ENCOUNTER — Ambulatory Visit (INDEPENDENT_AMBULATORY_CARE_PROVIDER_SITE_OTHER)
Admission: RE | Admit: 2014-02-24 | Discharge: 2014-02-24 | Disposition: A | Discharge status: Home / Self Care | Payer: BC Managed Care – PPO | Source: Ambulatory Visit | Attending: Family Medicine | Admitting: Family Medicine

## 2014-02-24 ENCOUNTER — Encounter: Payer: Self-pay | Admitting: Family Medicine

## 2014-02-24 VITALS — BP 118/64 | HR 80 | Temp 98.4°F | Ht 70.0 in | Wt 202.5 lb

## 2014-02-24 DIAGNOSIS — D72829 Elevated white blood cell count, unspecified: Secondary | ICD-10-CM

## 2014-02-24 DIAGNOSIS — D509 Iron deficiency anemia, unspecified: Secondary | ICD-10-CM

## 2014-02-24 NOTE — Assessment & Plan Note (Signed)
Per pt over 13 Pending labs from other office  She has had intermittently inc wbc past No symptoms  ua and cxr today  Then f/u hematology for addn w/u if all nl  Nl exam today

## 2014-02-24 NOTE — Telephone Encounter (Signed)
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 No abnormal cells since then. Pt sees OBGYN q 6 months. Last seen 4 months ago.Pt did not know if this may be related to elevated WBC.

## 2014-02-24 NOTE — Telephone Encounter (Signed)
Thanks for letting me know -I do not think that would affect WBC- but appreciate the info

## 2014-02-24 NOTE — Progress Notes (Signed)
Subjective:    Patient ID: Cindy [name], female    DOB: 05-Nov-1985, 28 y.o.   MRN: [id number]  HPI Here for leukocytosis   She had recent labs Is anemic - (gastric bypass in the past) - HCT of 33 - and she is a hematologist - in [place]  ? Name of Doctor  Decided not to do iron infusion- and not bad enough She is taking ferrous sulfate TID- with miralax for constipation (since April)  It is slowly improving   She had wbc ct checked by surgery (wants to have skin surgery)  13.8 wbc -can't do it until we find out why  F/u hematology is 13th   Is on latuda for bipolar  Changed from klonopin to xanax again    Lab Results  Component Value Date   WBC 11.4* 09/24/2013   HGB 11.6* 09/24/2013   HCT 36.0 09/24/2013   MCV 77.1* 09/24/2013   PLT 309 09/24/2013     No symptoms at all  No fever  No cold symptoms  No abd pain or diarrhea  No urinary symptoms -- no burning or frequency   Patient Active Problem List   Diagnosis Date Noted  . Severe recurrent major depression without psychotic features 09/23/2013  . Acne 04/27/2013  . Anemia, iron deficiency 04/27/2013  . Bariatric surgery status 04/27/2013  . B12 deficiency 04/27/2013  . Unspecified vitamin D deficiency 04/27/2013  . ANXIETY 11/02/2008   Past Medical History  Diagnosis Date  . Obesity   . Depression   . History of chicken pox     around age 78  . History of colonic diverticulitis     once at age 54  . History of migraine   . History of hay fever   . History of high blood pressure   . History of UTI   . History of heartburn     and GERD sometimes    Past Surgical History  Procedure Laterality Date  . Gastric bypass  12/2008  . Leap    . Tonsillectomy and adenoidectomy  1991   History  Substance Use Topics  . Smoking status: Never Smoker   . Smokeless tobacco: Never Used  . Alcohol Use: 0.0 oz/week     Comment: occ   Family History  Problem Relation Age of Onset  . Schizophrenia Mother   . Bipolar  disorder Mother   . Alcohol abuse Maternal Grandfather   . Arthritis Mother     in her back  . Heart disease Paternal Grandmother   . High blood pressure Father   . Diabetes Paternal Aunt    Allergies  Allergen Reactions  . Cefaclor     REACTION: Hives   Current Outpatient Prescriptions on File Prior to Visit  Medication Sig Dispense Refill  . alum & mag hydroxide-simeth (MAALOX PLUS) 400-400-40 MG/5ML suspension Take 15 mLs by mouth every 6 (six) hours as needed for indigestion.      . calcium carbonate (OS-CAL - DOSED IN MG OF ELEMENTAL CALCIUM) 1250 MG tablet Take 1 tablet by mouth 2 (two) times daily with a meal.      . cetirizine (ZYRTEC) 10 MG tablet Take 10 mg by mouth daily.      . cholecalciferol (VITAMIN D) 1000 UNITS tablet Take 4,000 Units by mouth daily.      . DULoxetine (CYMBALTA) 60 MG capsule Take 1 capsule (60 mg total) by mouth daily.  30 capsule  0  . ferrous sulfate 325 (  65 FE) MG tablet Take 325 mg by mouth 3 (three) times daily with meals.      . fluticasone (FLONASE) 50 MCG/ACT nasal spray Place 2 sprays into both nostrils daily.  16 g  6  . levocetirizine (XYZAL) 5 MG tablet Take 1 tablet (5 mg total) by mouth every evening.  30 tablet  2  . methocarbamol (ROBAXIN) 500 MG tablet Take 1 tablet (500 mg total) by mouth every 6 (six) hours as needed for muscle spasms.  30 tablet  0  . metroNIDAZOLE (METROGEL) 0.75 % vaginal gel Place 1 Applicatorful vaginally 2 (two) times daily.  70 g  0  . Multiple Vitamin (MULTIVITAMIN WITH MINERALS) TABS Take 1 tablet by mouth 3 (three) times daily.      . ondansetron (ZOFRAN ODT) 4 MG disintegrating tablet Take 1 tablet (4 mg total) by mouth every 6 (six) hours as needed for nausea.  10 tablet  0  . traZODone (DESYREL) 50 MG tablet Take 50-100 mg by mouth at bedtime as needed for sleep.       No current facility-administered medications on file prior to visit.        Review of Systems Review of Systems  Constitutional:  Negative for fever, appetite change, fatigue and unexpected weight change.  Eyes: Negative for pain and visual disturbance.  Respiratory: Negative for cough and shortness of breath.   Cardiovascular: Negative for cp or palpitations    Gastrointestinal: Negative for nausea, diarrhea and constipation.  Genitourinary: Negative for urgency and frequency.  Skin: Negative for pallor or rash   Neurological: Negative for weakness, light-headedness, numbness and headaches.  Hematological: Negative for adenopathy. Does not bruise/bleed easily.  Psychiatric/Behavioral: Negative for dysphoric mood. The patient is not nervous/anxious.         Objective:   Physical Exam  Constitutional: She appears well-developed and well-nourished. No distress.  HENT:  Head: Normocephalic and atraumatic.  Right Ear: External ear normal.  Left Ear: External ear normal.  Nose: Nose normal.  Mouth/Throat: Oropharynx is clear and moist.  Eyes: Conjunctivae and EOM are normal. Pupils are equal, round, and reactive to light. Right eye exhibits no discharge. Left eye exhibits no discharge. No scleral icterus.  Neck: Normal range of motion. Neck supple. No JVD present. No thyromegaly present.  Cardiovascular: Normal rate, regular rhythm, normal heart sounds and intact distal pulses.  Exam reveals no gallop.   Pulmonary/Chest: Effort normal and breath sounds normal. No respiratory distress. She has no wheezes. She has no rales.  Abdominal: Soft. Bowel sounds are normal. She exhibits no distension and no mass. There is no tenderness.  Musculoskeletal: She exhibits no edema and no tenderness.  Lymphadenopathy:    She has no cervical adenopathy.    She has no axillary adenopathy.       Right: No inguinal, no supraclavicular and no epitrochlear adenopathy present.       Left: No inguinal, no supraclavicular and no epitrochlear adenopathy present.  No adenopathy noted   Neurological: She is alert. She has normal reflexes. No  cranial nerve deficit. She exhibits normal muscle tone. Coordination normal.  Skin: Skin is warm and dry. No rash noted. No erythema. No pallor.  Psychiatric: She has a normal mood and affect.          Assessment & Plan:   Problem List Items Addressed This Visit     Other   Anemia, iron deficiency - Primary     Suspect due to malabs from her  wt loss surgery in past Continue ferrous sulfate tid F/u heme next week as planned       Leukocytosis, unspecified     Per pt over 13 Pending labs from other office  She has had intermittently inc wbc past No symptoms  ua and cxr today  Then f/u hematology for addn w/u if all nl  Nl exam today    Relevant Orders      Urine culture (Completed)      DG Chest 2 View (Completed)      Urinalysis Dipstick

## 2014-02-24 NOTE — Progress Notes (Signed)
Pre visit review using our clinic review tool, if applicable. No additional management support is needed unless otherwise documented below in the visit note. 

## 2014-02-24 NOTE — Patient Instructions (Signed)
Continue your iron Urinalysis and urine culture today- to see if any infection  Chest xray today - also to check for signs of infection  Address with hematology at your next visit

## 2014-02-24 NOTE — Assessment & Plan Note (Signed)
Suspect due to malabs from her wt loss surgery in past Continue ferrous sulfate tid F/u heme next week as planned

## 2014-02-26 LAB — URINE CULTURE

## 2014-12-25 IMAGING — CR DG ABDOMEN ACUTE W/ 1V CHEST
3 series · 3 of 3 positions shown · non-contrast
Comparison: None.

CLINICAL DATA: Dizziness and nausea.

ACUTE ABDOMEN SERIES (ABDOMEN 2 VIEW & CHEST 1 VIEW)

[w chest pa]
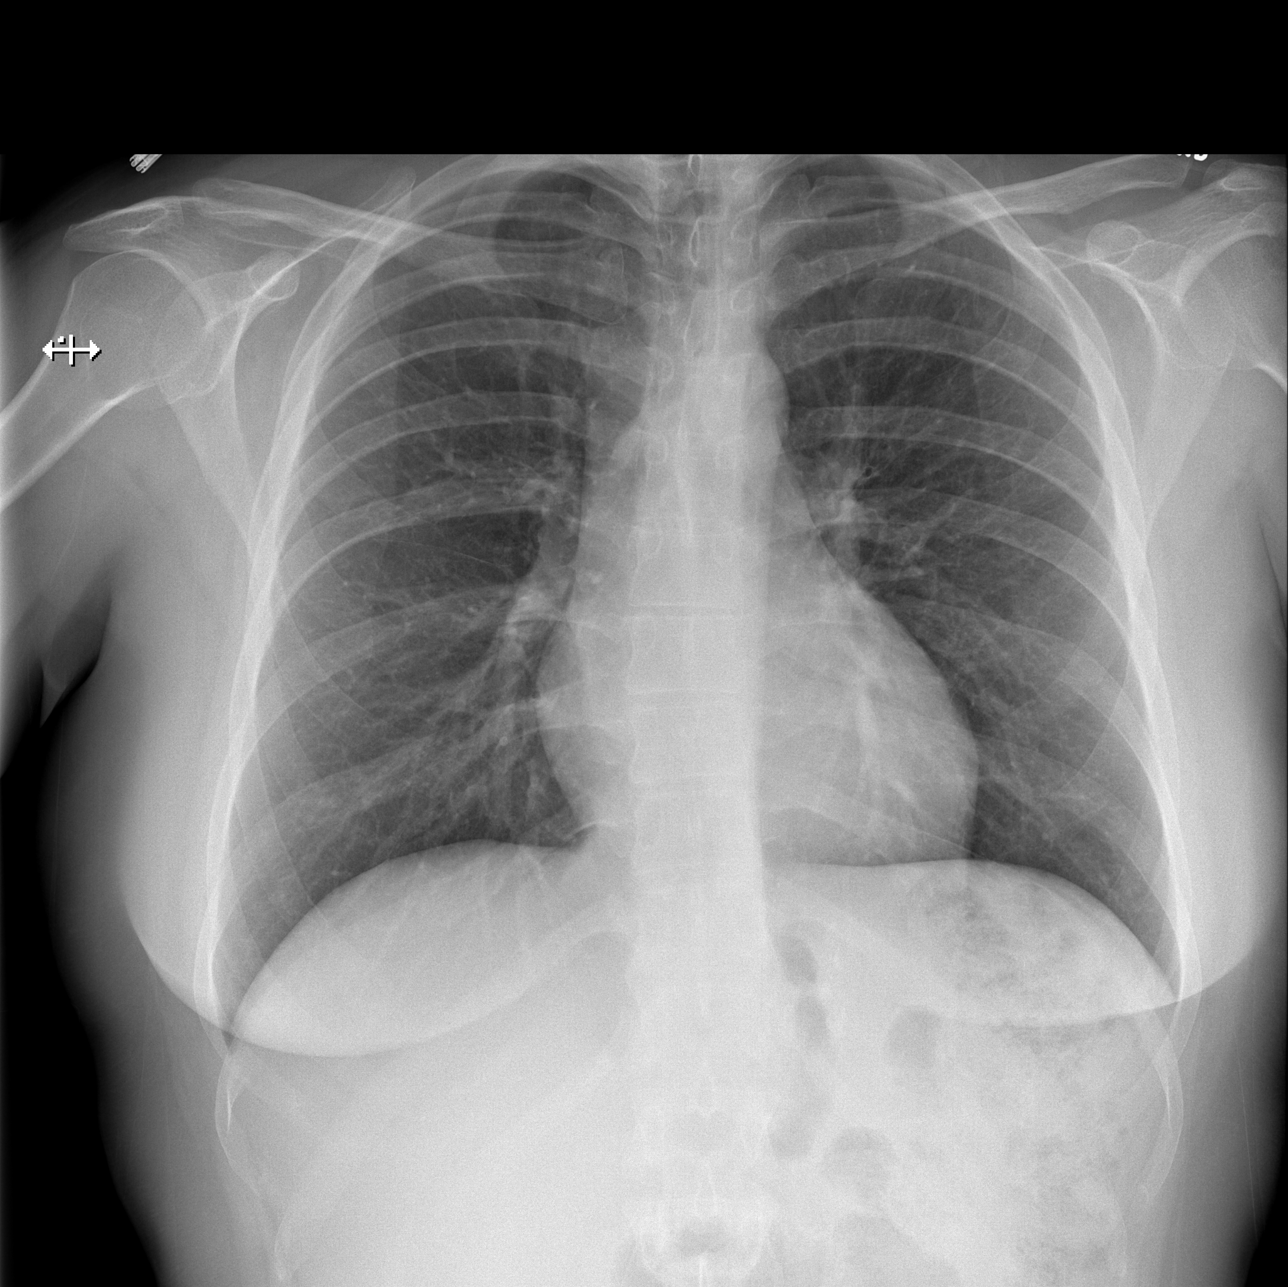

[w abdomen upright]
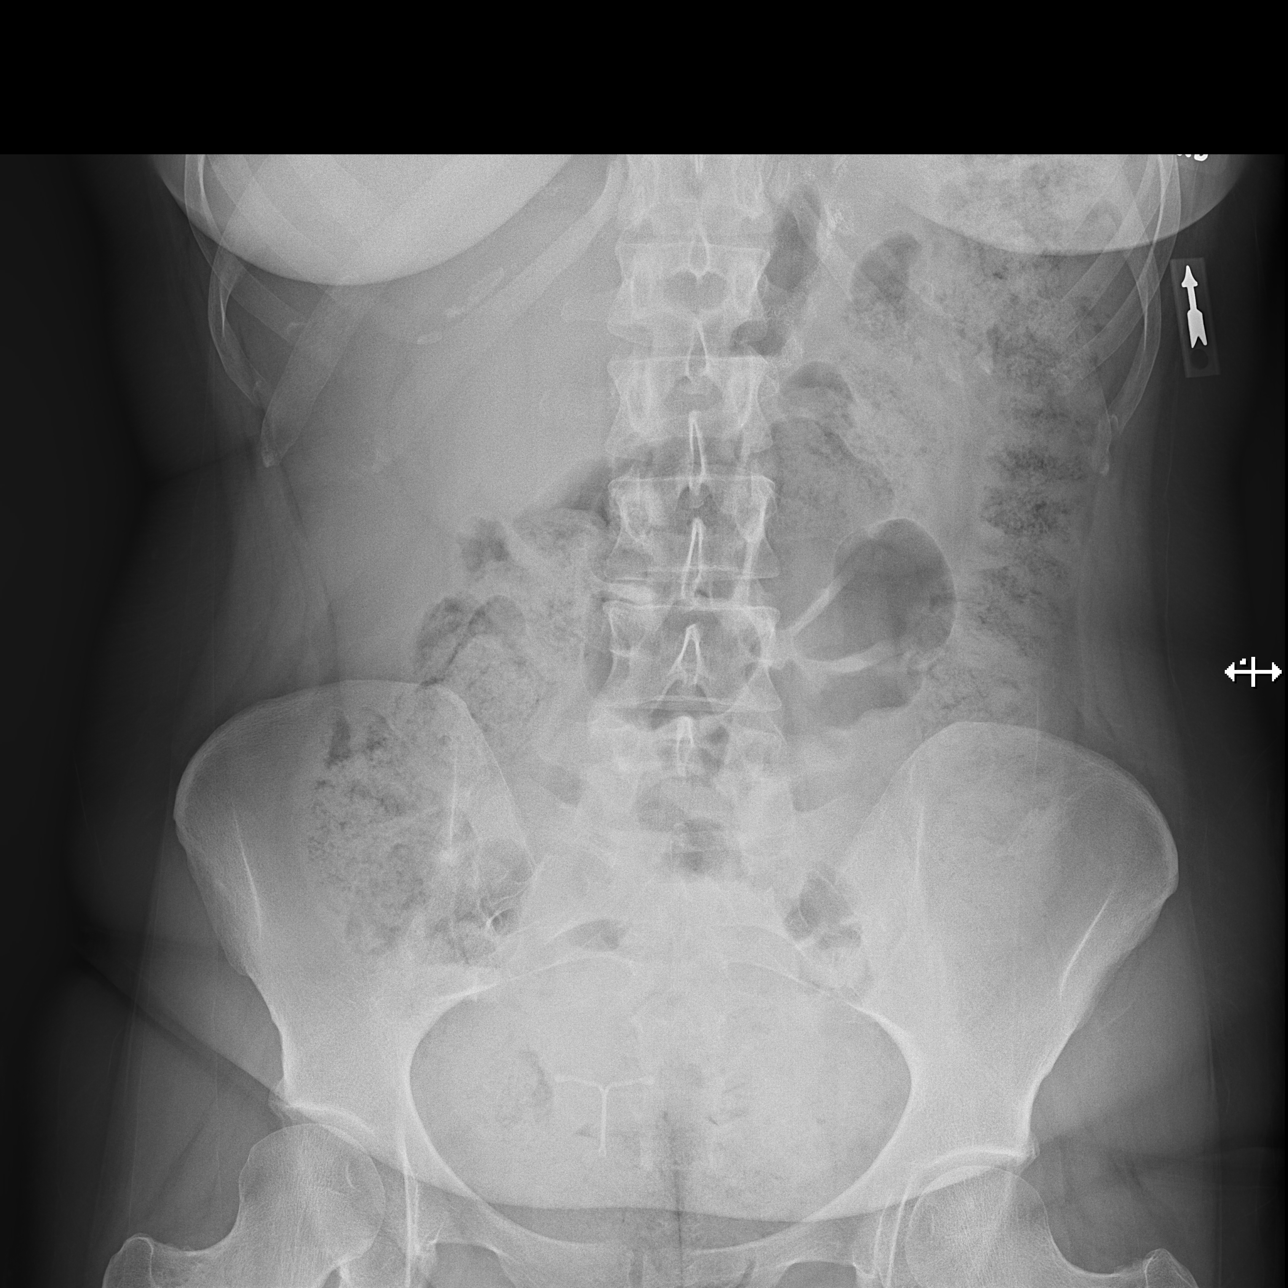

[t abdomen supine]
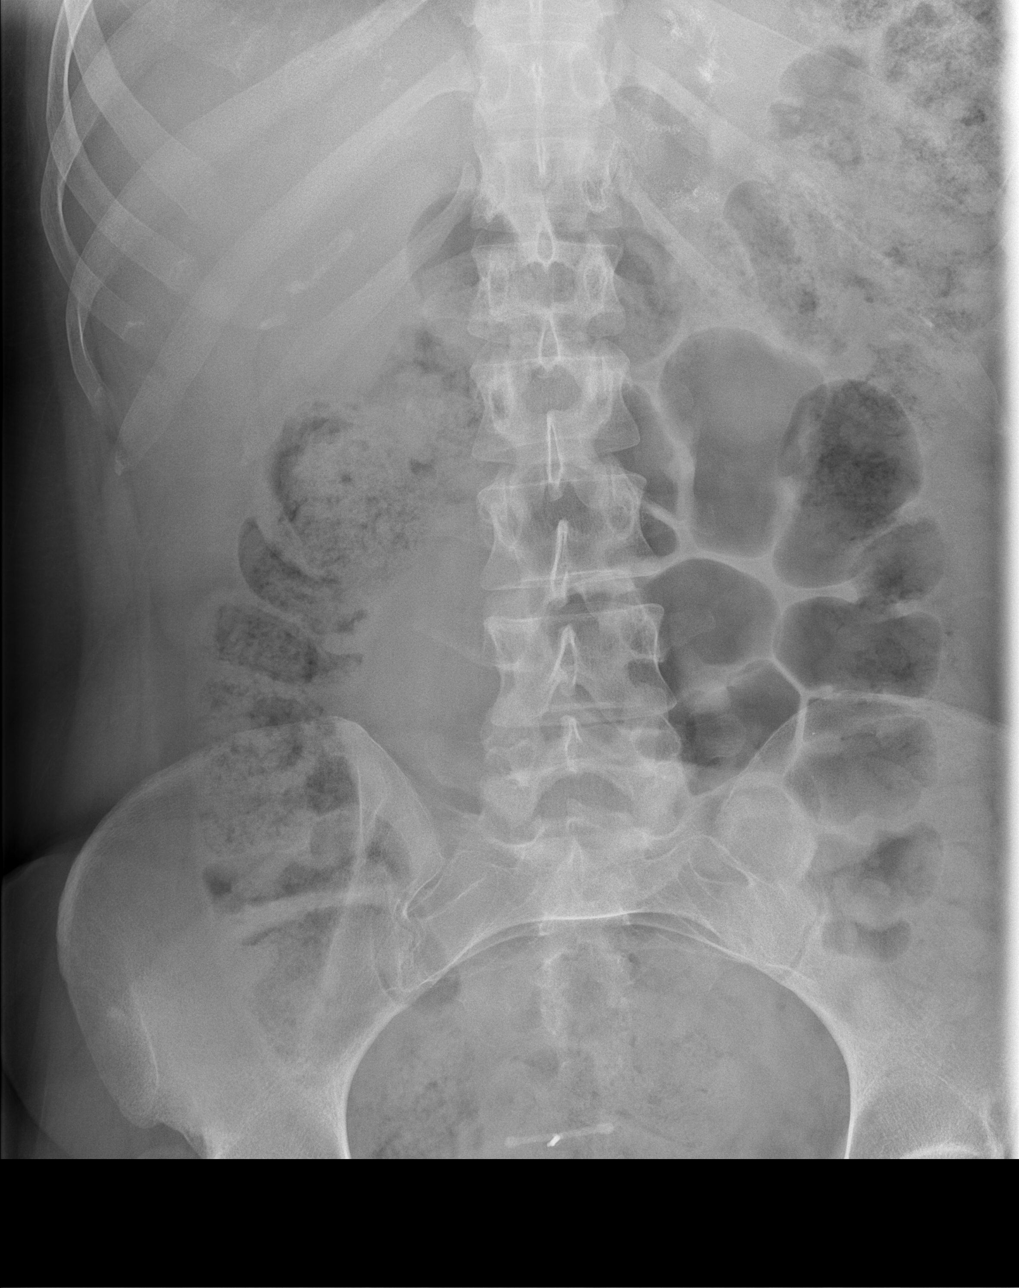

[3 of 3 positions shown; findings below may reference images not displayed]

FINDINGS: Chest radiograph demonstrates clear lungs.  No evidence
for free air.  Large amount of stool throughout the colon.  There
is an IUD in the pelvis.  Nonobstructive bowel gas pattern.
Postsurgical changes in the upper abdomen.
IMPRESSION: No acute chest abnormality.

Large amount of stool in the abdomen.  Nonobstructive bowel gas
pattern.

## 2015-11-16 IMAGING — CR DG CHEST 2V
2 series · 2 of 2 positions shown · non-contrast
Comparison: 04/04/2013

CLINICAL DATA: Leukocytosis

EXAM:
CHEST  2 VIEW

[view not recorded (1 of 2)]
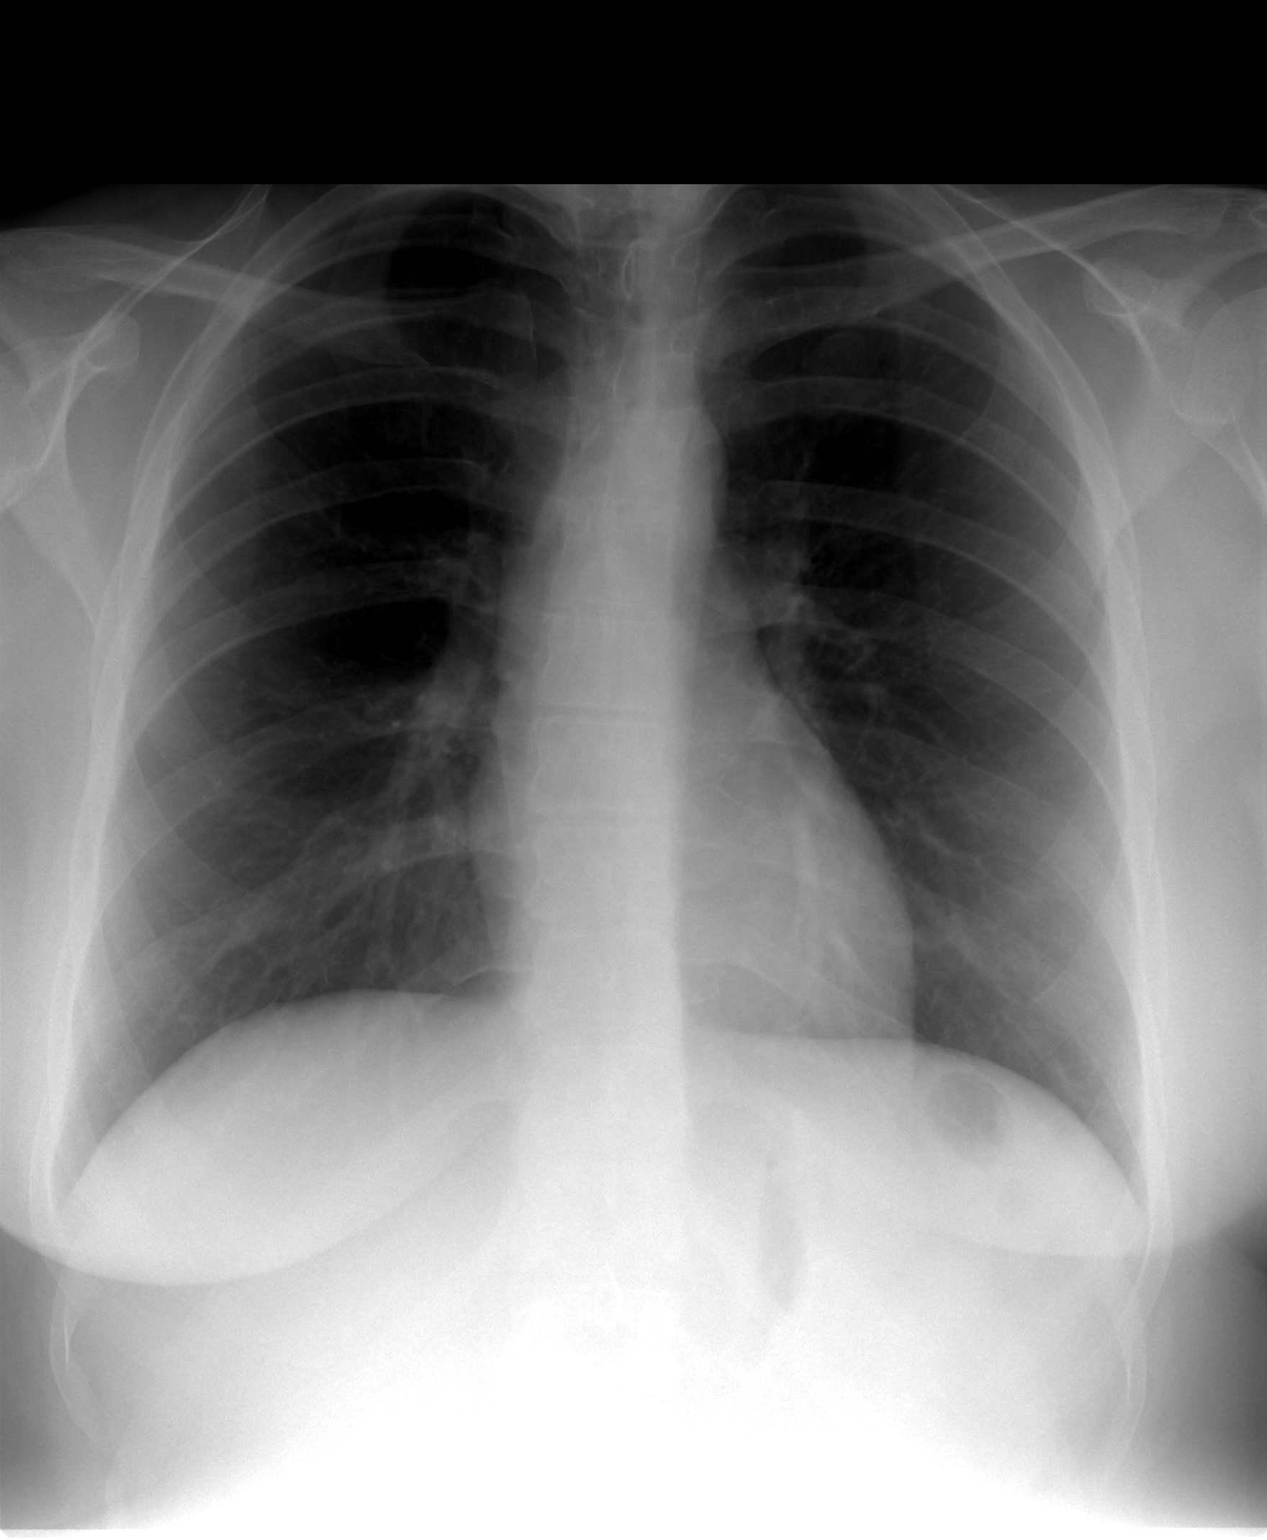

[view not recorded (2 of 2)]
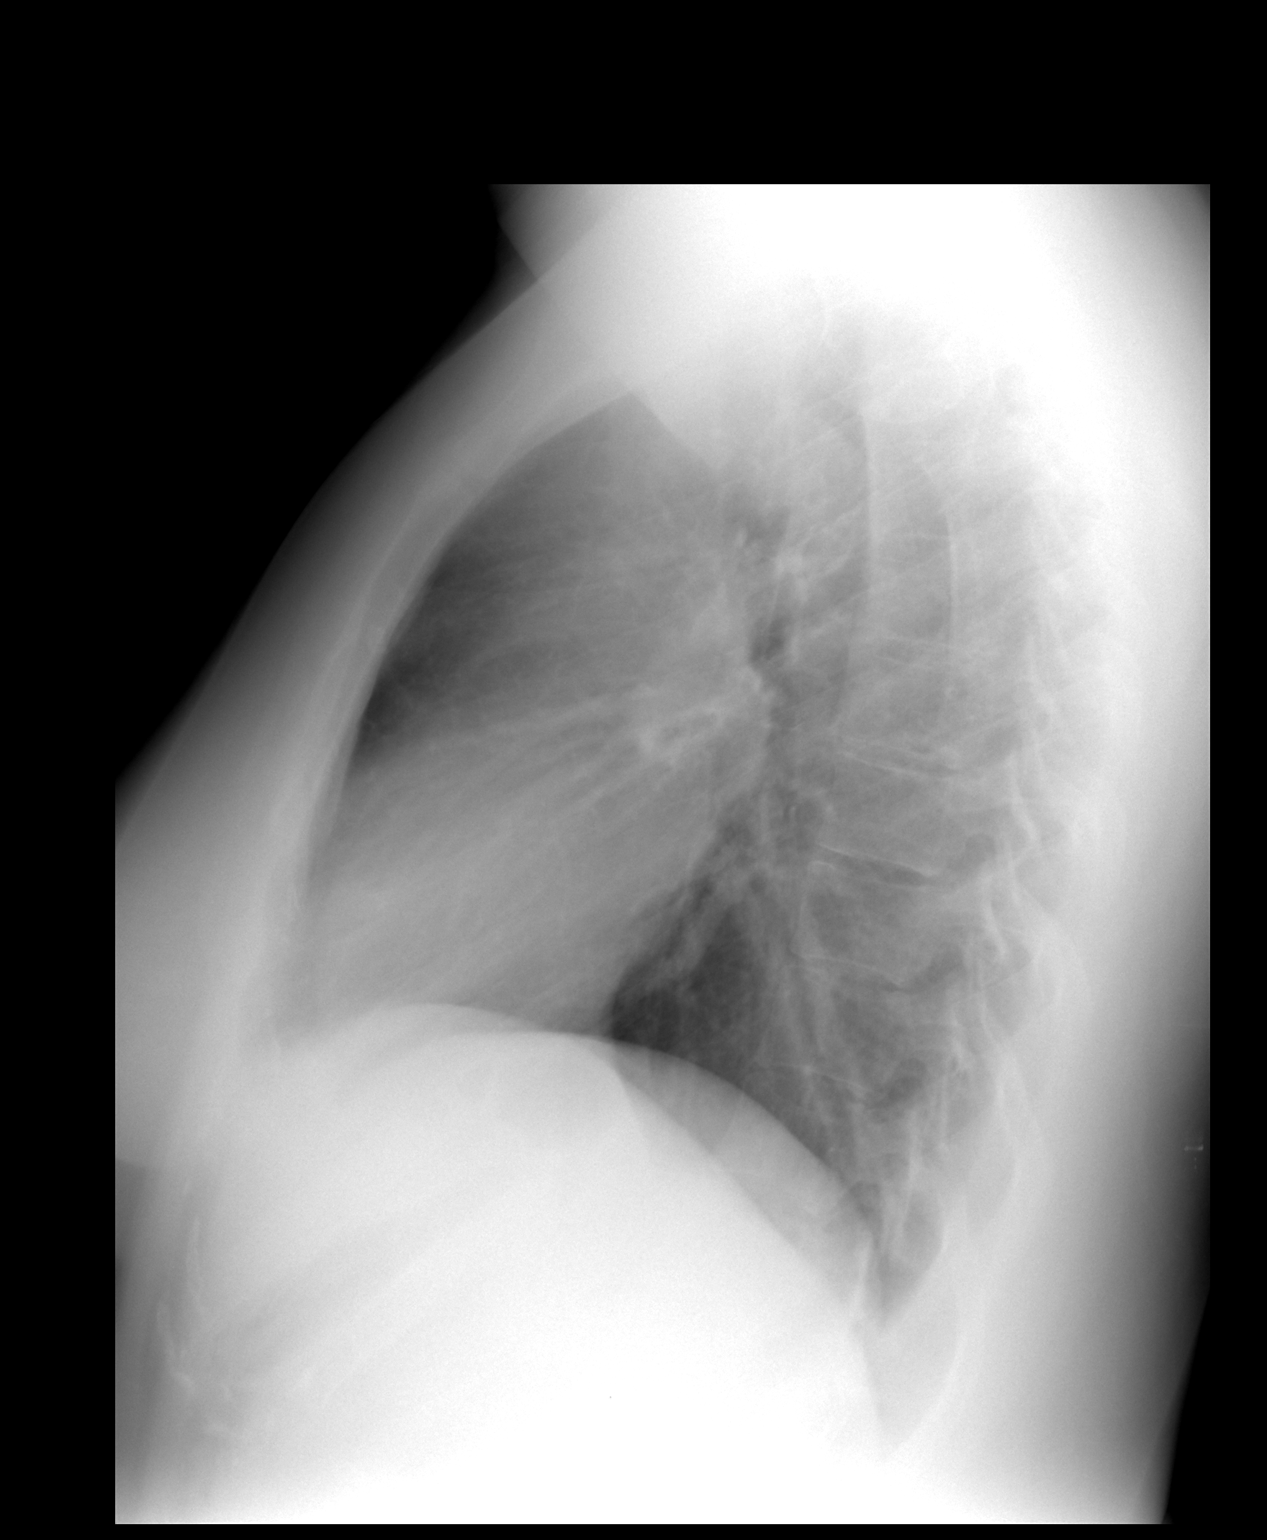

[2 of 2 positions shown; findings below may reference images not displayed]

FINDINGS: Lungs are clear.  No pleural effusion or pneumothorax.

The heart is normal in size.

Visualized osseous structures are within normal limits.
IMPRESSION: No evidence of acute cardiopulmonary disease.

## 2023-03-08 ENCOUNTER — Ambulatory Visit
Admission: EM | Admit: 2023-03-08 | Discharge: 2023-03-08 | Disposition: A | Discharge status: Home / Self Care | Payer: Self-pay

## 2023-03-08 ENCOUNTER — Other Ambulatory Visit: Payer: Self-pay

## 2023-03-08 ENCOUNTER — Inpatient Hospital Stay
Admission: EM | Admit: 2023-03-08 | Discharge: 2023-03-10 | DRG: 813 | Disposition: A | Discharge status: Home / Self Care | Payer: PRIVATE HEALTH INSURANCE | Attending: Internal Medicine | Admitting: Internal Medicine

## 2023-03-08 ENCOUNTER — Encounter: Payer: Self-pay | Admitting: Internal Medicine

## 2023-03-08 ENCOUNTER — Observation Stay: Payer: PRIVATE HEALTH INSURANCE

## 2023-03-08 DIAGNOSIS — Z23 Encounter for immunization: Secondary | ICD-10-CM

## 2023-03-08 DIAGNOSIS — Z975 Presence of (intrauterine) contraceptive device: Secondary | ICD-10-CM

## 2023-03-08 DIAGNOSIS — Z8261 Family history of arthritis: Secondary | ICD-10-CM

## 2023-03-08 DIAGNOSIS — R233 Spontaneous ecchymoses: Secondary | ICD-10-CM

## 2023-03-08 DIAGNOSIS — F32A Depression, unspecified: Secondary | ICD-10-CM | POA: Diagnosis present

## 2023-03-08 DIAGNOSIS — Z79899 Other long term (current) drug therapy: Secondary | ICD-10-CM

## 2023-03-08 DIAGNOSIS — D693 Immune thrombocytopenic purpura: Secondary | ICD-10-CM | POA: Diagnosis not present

## 2023-03-08 DIAGNOSIS — Z8249 Family history of ischemic heart disease and other diseases of the circulatory system: Secondary | ICD-10-CM

## 2023-03-08 DIAGNOSIS — K219 Gastro-esophageal reflux disease without esophagitis: Secondary | ICD-10-CM | POA: Diagnosis present

## 2023-03-08 DIAGNOSIS — Z818 Family history of other mental and behavioral disorders: Secondary | ICD-10-CM

## 2023-03-08 DIAGNOSIS — R002 Palpitations: Secondary | ICD-10-CM | POA: Diagnosis present

## 2023-03-08 DIAGNOSIS — F419 Anxiety disorder, unspecified: Secondary | ICD-10-CM | POA: Diagnosis present

## 2023-03-08 DIAGNOSIS — F411 Generalized anxiety disorder: Secondary | ICD-10-CM | POA: Diagnosis present

## 2023-03-08 DIAGNOSIS — Z9884 Bariatric surgery status: Secondary | ICD-10-CM

## 2023-03-08 DIAGNOSIS — D696 Thrombocytopenia, unspecified: Principal | ICD-10-CM | POA: Diagnosis present

## 2023-03-08 DIAGNOSIS — F319 Bipolar disorder, unspecified: Secondary | ICD-10-CM | POA: Diagnosis present

## 2023-03-08 DIAGNOSIS — Z87440 Personal history of urinary (tract) infections: Secondary | ICD-10-CM

## 2023-03-08 DIAGNOSIS — R58 Hemorrhage, not elsewhere classified: Secondary | ICD-10-CM

## 2023-03-08 DIAGNOSIS — Z888 Allergy status to other drugs, medicaments and biological substances status: Secondary | ICD-10-CM

## 2023-03-08 DIAGNOSIS — T380X5A Adverse effect of glucocorticoids and synthetic analogues, initial encounter: Secondary | ICD-10-CM | POA: Diagnosis present

## 2023-03-08 DIAGNOSIS — Z833 Family history of diabetes mellitus: Secondary | ICD-10-CM

## 2023-03-08 DIAGNOSIS — R21 Rash and other nonspecific skin eruption: Secondary | ICD-10-CM | POA: Diagnosis present

## 2023-03-08 HISTORY — DX: Bipolar disorder, unspecified: F31.9

## 2023-03-08 LAB — TYPE AND SCREEN
ABO/RH(D): O NEG
Antibody Screen: NEGATIVE

## 2023-03-08 LAB — RETICULOCYTES
Immature Retic Fract: 6.1 % (ref 2.3–15.9)
RBC.: 4.36 MIL/uL (ref 3.87–5.11)
Retic Count, Absolute: 62.3 10*3/uL (ref 19.0–186.0)
Retic Ct Pct: 1.4 % (ref 0.4–3.1)

## 2023-03-08 LAB — BASIC METABOLIC PANEL
Anion gap: 8 (ref 5–15)
BUN: 8 mg/dL (ref 6–20)
CO2: 24 mmol/L (ref 22–32)
Calcium: 9.3 mg/dL (ref 8.9–10.3)
Chloride: 106 mmol/L (ref 98–111)
Creatinine, Ser: 0.87 mg/dL (ref 0.44–1.00)
GFR, Estimated: >60 mL/min (ref >60)
Glucose, Bld: 93 mg/dL (ref 70–99)
Potassium: 4 mmol/L (ref 3.5–5.1)
Sodium: 138 mmol/L (ref 135–145)

## 2023-03-08 LAB — PROTIME-INR
INR: 1.2 (ref 0.8–1.2)
Prothrombin Time: 15.4 seconds — ABNORMAL HIGH (ref 11.4–15.2)

## 2023-03-08 LAB — CBC WITH DIFFERENTIAL/PLATELET
Abs Immature Granulocytes: 0.06 10*3/uL (ref 0.00–0.07)
Basophils Absolute: 0.1 10*3/uL (ref 0.0–0.1)
Basophils Relative: 1 %
Eosinophils Absolute: 0 10*3/uL (ref 0.0–0.5)
Eosinophils Relative: 0 %
HCT: 39.9 % (ref 36.0–46.0)
Hemoglobin: 12.6 g/dL (ref 12.0–15.0)
Immature Granulocytes: 1 %
Lymphocytes Relative: 26 %
Lymphs Abs: 2.8 10*3/uL (ref 0.7–4.0)
MCH: 27.9 pg (ref 26.0–34.0)
MCHC: 31.6 g/dL (ref 30.0–36.0)
MCV: 88.5 fL (ref 80.0–100.0)
Monocytes Absolute: 0.9 10*3/uL (ref 0.1–1.0)
Monocytes Relative: 8 %
Neutro Abs: 6.7 10*3/uL (ref 1.7–7.7)
Neutrophils Relative %: 64 %
Platelets: 6 10*3/uL — CL (ref 150–400)
RBC: 4.51 MIL/uL (ref 3.87–5.11)
RDW: 13.4 % (ref 11.5–15.5)
Smear Review: DECREASED
WBC: 10.5 10*3/uL (ref 4.0–10.5)
nRBC: 0 % (ref 0.0–0.2)

## 2023-03-08 LAB — PREPARE PLATELET PHERESIS: Unit division: 0

## 2023-03-08 LAB — BPAM PLATELET PHERESIS
Blood Product Expiration Date: 202406192359
ISSUE DATE / TIME: 202406162138
Unit Type and Rh: 7300

## 2023-03-08 LAB — TECHNOLOGIST SMEAR REVIEW: Plt Morphology: DECREASED

## 2023-03-08 LAB — HEPATIC FUNCTION PANEL
ALT: 21 U/L (ref 0–44)
AST: 15 U/L (ref 15–41)
Albumin: 4.4 g/dL (ref 3.5–5.0)
Alkaline Phosphatase: 88 U/L (ref 38–126)
Bilirubin, Direct: 0.1 mg/dL (ref 0.0–0.2)
Indirect Bilirubin: 0.6 mg/dL (ref 0.3–0.9)
Total Bilirubin: 0.7 mg/dL (ref 0.3–1.2)
Total Protein: 6.9 g/dL (ref 6.5–8.1)

## 2023-03-08 LAB — LACTATE DEHYDROGENASE: LDH: 183 U/L (ref 98–192)

## 2023-03-08 LAB — PLATELET COUNT: Platelets: <5 10*3/uL — CL (ref 150–400)

## 2023-03-08 LAB — APTT: aPTT: 28 seconds (ref 24–36)

## 2023-03-08 LAB — LITHIUM LEVEL: Lithium Lvl: 0.69 mmol/L (ref 0.60–1.20)

## 2023-03-08 LAB — ABO/RH: ABO/RH(D): O NEG

## 2023-03-08 LAB — IMMATURE PLATELET FRACTION: Immature Platelet Fraction: 36.7 % — ABNORMAL HIGH (ref 1.2–8.6)

## 2023-03-08 MED ORDER — ADULT MULTIVITAMIN W/MINERALS CH
1.0000 | ORAL_TABLET | Freq: Every day | ORAL | Status: DC
  Administered 2023-03-08 – 2023-03-10 (×3): 1 via ORAL
  Filled 2023-03-08 (×3): qty 1

## 2023-03-08 MED ORDER — PROPRANOLOL HCL 20 MG PO TABS
30.0000 mg | ORAL_TABLET | Freq: Two times a day (BID) | ORAL | Status: DC
  Administered 2023-03-08 – 2023-03-10 (×4): 30 mg via ORAL
  Filled 2023-03-08 (×4): qty 2

## 2023-03-08 MED ORDER — LITHIUM CARBONATE ER 300 MG PO TBCR
300.0000 mg | EXTENDED_RELEASE_TABLET | Freq: Two times a day (BID) | ORAL | Status: DC
  Administered 2023-03-08 – 2023-03-10 (×4): 300 mg via ORAL
  Filled 2023-03-08 (×5): qty 1

## 2023-03-08 MED ORDER — METHYLPREDNISOLONE SODIUM SUCC 125 MG IJ SOLR
80.0000 mg | Freq: Every day | INTRAMUSCULAR | Status: DC
  Administered 2023-03-08: 80 mg via INTRAVENOUS
  Filled 2023-03-08: qty 2

## 2023-03-08 MED ORDER — ACETAMINOPHEN 325 MG PO TABS
650.0000 mg | ORAL_TABLET | Freq: Four times a day (QID) | ORAL | Status: DC | PRN
  Administered 2023-03-08 – 2023-03-10 (×4): 650 mg via ORAL
  Filled 2023-03-08 (×3): qty 2

## 2023-03-08 MED ORDER — PANTOPRAZOLE SODIUM 40 MG PO TBEC
40.0000 mg | DELAYED_RELEASE_TABLET | Freq: Two times a day (BID) | ORAL | Status: DC
  Administered 2023-03-08 – 2023-03-10 (×4): 40 mg via ORAL
  Filled 2023-03-08 (×4): qty 1

## 2023-03-08 MED ORDER — OMEPRAZOLE MAGNESIUM 20 MG PO TBEC: 20.0000 mg | DELAYED_RELEASE_TABLET | Freq: Two times a day (BID) | ORAL | Status: DC

## 2023-03-08 MED ORDER — LAMOTRIGINE 25 MG PO TABS
150.0000 mg | ORAL_TABLET | Freq: Two times a day (BID) | ORAL | Status: DC
  Administered 2023-03-08 – 2023-03-10 (×4): 150 mg via ORAL
  Filled 2023-03-08 (×2): qty 6
  Filled 2023-03-08: qty 2
  Filled 2023-03-08: qty 6

## 2023-03-08 MED ORDER — SODIUM CHLORIDE 0.9 % IV SOLN: 10.0000 mL/h | Freq: Once | INTRAVENOUS | Status: DC

## 2023-03-08 MED ORDER — OLANZAPINE 5 MG PO TABS
10.0000 mg | ORAL_TABLET | Freq: Every day | ORAL | Status: DC
  Administered 2023-03-08 – 2023-03-09 (×2): 10 mg via ORAL
  Filled 2023-03-08: qty 2
  Filled 2023-03-08: qty 1

## 2023-03-08 MED ORDER — ARIPIPRAZOLE 2 MG PO TABS
10.0000 mg | ORAL_TABLET | Freq: Every morning | ORAL | Status: DC
  Administered 2023-03-09 – 2023-03-10 (×2): 10 mg via ORAL
  Filled 2023-03-08 (×2): qty 5

## 2023-03-08 MED ORDER — ONDANSETRON HCL 4 MG/2ML IJ SOLN: 4.0000 mg | Freq: Three times a day (TID) | INTRAMUSCULAR | Status: DC | PRN

## 2023-03-08 MED ORDER — NALTREXONE HCL 50 MG PO TABS
50.0000 mg | ORAL_TABLET | Freq: Every day | ORAL | Status: DC
  Administered 2023-03-08 – 2023-03-09 (×2): 50 mg via ORAL
  Filled 2023-03-08 (×3): qty 1

## 2023-03-08 NOTE — ED Notes (Signed)
Dr. Clyde Lundborg, admitting doc at bedside

## 2023-03-08 NOTE — ED Provider Notes (Signed)
Renaldo Fiddler    CSN: [id number] Arrival date & time: 03/08/23  1403      History   Chief Complaint Chief Complaint  Patient presents with   Rash    HPI Cindy [name] is a 37 y.o. female.  Apnea by her mother, patient presents with a petechial rash and unexplained bruising x 5 days.  Patient has petechial rash on her lower extremities which is spreading.  She has several unexplained bruises on her arms.  No falls or injury.  She denies fever, malaise, sore throat, cough, shortness of breath, chest pain, or other symptoms.  She is not on anticoagulants and has not taken any OTC medications.  Her medical history includes iron deficiency anemia, B12 deficiency, vitamin D deficiency, bariatric surgery, anxiety, depression.  The history is provided by the patient and medical records.    Past Medical History:  Diagnosis Date   Depression    History of chicken pox    around age 54   History of colonic diverticulitis    once at age 36   History of hay fever    History of heartburn    and GERD sometimes    History of high blood pressure    History of migraine    History of UTI    Obesity     Patient Active Problem List   Diagnosis Date Noted   Leukocytosis, unspecified 02/24/2014   Severe recurrent major depression without psychotic features (HCC) 09/23/2013   Acne 04/27/2013   Anemia, iron deficiency 04/27/2013   Bariatric surgery status 04/27/2013   B12 deficiency 04/27/2013   Unspecified vitamin D deficiency 04/27/2013   ANXIETY 11/02/2008    Past Surgical History:  Procedure Laterality Date   GASTRIC BYPASS  12/2008   LEAP     TONSILLECTOMY AND ADENOIDECTOMY  1991    OB History   No obstetric history on file.      Home Medications    Prior to Admission medications   Medication Sig Start Date End Date Taking? Authorizing Provider  levonorgestrel (MIRENA, 52 MG,) 20 MCG/DAY IUD Take 1 insert by intrauterine route. 03/19/17  Yes [provider]   lithium carbonate (LITHOBID) 300 MG ER tablet Take 600 mg by mouth 2 (two) times daily. 01/26/23  Yes [provider]  naltrexone (DEPADE) 50 MG tablet Take 50 mg by mouth daily. 02/15/23  Yes [provider]  OLANZapine (ZYPREXA) 10 MG tablet Take 10 mg by mouth at bedtime.   Yes [provider]  ALPRAZolam Prudy Feeler) 1 MG tablet Take 1 mg by mouth 3 (three) times daily as needed for anxiety. Patient not taking: Reported on 03/08/2023    [provider]  alum & mag hydroxide-simeth (MAALOX PLUS) 400-400-40 MG/5ML suspension Take 15 mLs by mouth every 6 (six) hours as needed for indigestion.    [provider]  calcium carbonate (OS-CAL - DOSED IN MG OF ELEMENTAL CALCIUM) 1250 MG tablet Take 1 tablet by mouth 2 (two) times daily with a meal.    [provider]  cetirizine (ZYRTEC) 10 MG tablet Take 10 mg by mouth daily.    [provider]  cholecalciferol (VITAMIN D) 1000 UNITS tablet Take 4,000 Units by mouth daily.    [provider]  DULoxetine (CYMBALTA) 60 MG capsule Take 1 capsule (60 mg total) by mouth daily. Patient not taking: Reported on 03/08/2023 09/28/13   Beau Fanny, FNP  ferrous sulfate 325 (65 FE) MG tablet Take 325  mg by mouth 3 (three) times daily with meals. Patient not taking: Reported on 03/08/2023    [provider]  fluticasone (FLONASE) 50 MCG/ACT nasal spray Place 2 sprays into both nostrils daily. 11/25/13   Lorre Munroe, NP  lamoTRIgine (LAMICTAL) 150 MG tablet Take 1 tablet by mouth 2 (two) times daily.    [provider]  levocetirizine (XYZAL) 5 MG tablet Take 1 tablet (5 mg total) by mouth every evening. 11/25/13   Lorre Munroe, NP  lurasidone (LATUDA) 40 MG TABS tablet Take 40 mg by mouth daily with breakfast. Patient not taking: Reported on 03/08/2023    [provider]  methocarbamol (ROBAXIN) 500 MG tablet Take 1 tablet (500 mg total) by mouth every 6 (six) hours as  needed for muscle spasms. Patient not taking: Reported on 03/08/2023 09/28/13   Beau Fanny, FNP  metroNIDAZOLE (METROGEL) 0.75 % vaginal gel Place 1 Applicatorful vaginally 2 (two) times daily. Patient not taking: Reported on 03/08/2023 11/25/13   Lorre Munroe, NP  Multiple Vitamin (MULTIVITAMIN WITH MINERALS) TABS Take 1 tablet by mouth 3 (three) times daily.    [provider]  ondansetron (ZOFRAN ODT) 4 MG disintegrating tablet Take 1 tablet (4 mg total) by mouth every 6 (six) hours as needed for nausea. Patient not taking: Reported on 03/08/2023 04/04/13   Jones Skene, MD  pantoprazole (PROTONIX) 40 MG tablet Take by mouth.    [provider]  traZODone (DESYREL) 50 MG tablet Take 50-100 mg by mouth at bedtime as needed for sleep. Patient not taking: Reported on 03/08/2023    [provider]    Family History Family History  Problem Relation Age of Onset   Schizophrenia Mother    Bipolar disorder Mother    Alcohol abuse Maternal Grandfather    Arthritis Mother        in her back   Heart disease Paternal Grandmother    High blood pressure Father    Diabetes Paternal Aunt     Social History Social History   Tobacco Use   Smoking status: Never   Smokeless tobacco: Never  Substance Use Topics   Alcohol use: Yes    Comment: occ   Drug use: No     Allergies   Cefaclor   Review of Systems Review of Systems  Constitutional:  Negative for chills and fever.  HENT:  Negative for ear pain and sore throat.   Respiratory:  Negative for cough and shortness of breath.   Cardiovascular:  Negative for chest pain and palpitations.  Gastrointestinal:  Negative for abdominal pain, diarrhea, nausea and vomiting.  Skin:  Positive for color change and wound.     Physical Exam Triage Vital Signs ED Triage Vitals  Enc Vitals Group     BP 03/08/23 1430 107/68     Pulse Rate 03/08/23 1420 60     Resp 03/08/23 1420 18     Temp 03/08/23 1420 97.9 F  (36.6 C)     Temp src --      SpO2 03/08/23 1420 97 %     Weight --      Height --      Head Circumference --      Peak Flow --      Pain Score 03/08/23 1425 1     Pain Loc --      Pain Edu? --      Excl. in GC? --    No data found.  Updated Vital Signs BP 107/68   Pulse 60   Temp 97.9 F (36.6 C)   Resp 18   SpO2 97%   Visual Acuity Right Eye Distance:   Left Eye Distance:   Bilateral Distance:    Right Eye Near:   Left Eye Near:    Bilateral Near:     Physical Exam Vitals and nursing note reviewed.  Constitutional:      General: She is not in acute distress.    Appearance: Normal appearance. She is well-developed. She is not ill-appearing.  HENT:     Mouth/Throat:     Mouth: Mucous membranes are moist.  Cardiovascular:     Rate and Rhythm: Normal rate and regular rhythm.     Heart sounds: Normal heart sounds.  Pulmonary:     Effort: Pulmonary effort is normal. No respiratory distress.     Breath sounds: Normal breath sounds.  Musculoskeletal:        General: No swelling or deformity. Normal range of motion.     Cervical back: Neck supple.  Skin:    General: Skin is warm and dry.     Capillary Refill: Capillary refill takes less than 2 seconds.     Findings: Bruising and rash present.     Comments: Several bruises on bilateral arms.  Petechial rash on bilateral legs.  Neurological:     General: No focal deficit present.     Mental Status: She is alert and oriented to person, place, and time.     Sensory: No sensory deficit.     Motor: No weakness.     Gait: Gait normal.  Psychiatric:        Mood and Affect: Mood normal.        Behavior: Behavior normal.      UC Treatments / Results  Labs (all labs ordered are listed, but only abnormal results are displayed) Labs Reviewed - No data to display  EKG   Radiology No results found.  Procedures Procedures (including critical care time)  Medications Ordered in UC Medications - No data to  display  Initial Impression / Assessment and Plan / UC Course  I have reviewed the triage vital signs and the nursing notes.  Pertinent labs & imaging results that were available during my care of the patient were reviewed by me and considered in my medical decision making (see chart for details).    Petechial rash, ecchymosis.  Patient is afebrile and vital signs are stable.  She is well-appearing other than the rash and bruising.  She has history of anemia and bariatric surgery.  She is not on anticoagulants.  Sending her to the ED for evaluation as she needs stat blood work.  She is agreeable to this.  She is accompanied by her mother and father who will take her to the ED.  Final Clinical Impressions(s) / UC Diagnoses   Final diagnoses:  Petechial rash  Ecchymosis     Discharge Instructions      Go to the emergency department for the petechial rash and bruising.      ED Prescriptions   None    PDMP not reviewed this encounter.   Mickie Bail, NP 03/08/23 1450

## 2023-03-08 NOTE — ED Notes (Signed)
Patient is being discharged from the Urgent Care and sent to the Emergency Department via POV . Per Wendee Beavers NP, patient is in need of higher level of care due to petechial rash. Patient is aware and verbalizes understanding of plan of care.  Vitals:   03/08/23 1420 03/08/23 1430  BP:  107/68  Pulse: 60   Resp: 18   Temp: 97.9 F (36.6 C)   SpO2: 97%

## 2023-03-08 NOTE — Progress Notes (Signed)
Received routines consult for thrombocytopenia.  Acute thrombocytopenia with Platelet 6000 Increased immature platelet fraction, normal LDH, normal bilirubin.  Haptoglobin is pending, normal reticulocyte percentage.  I spoke with hematology lab technician and tech smear showed no schistocytes.  I requested pathology smear to be added and asked technician to call on call pathologist to review smear today.  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  Check [place] abd/spleen, check B12, folate.  Recommend Solumedrol 1mg /kg, platelet transfusion to keep platelet count >=10 I will see patient tomorrow. Discussed plan with Dr. Clyde Lundborg.

## 2023-03-08 NOTE — ED Triage Notes (Signed)
Note from UC "Petechial rash and unexplained bruising x 5 days.  Patient has petechial rash on her lower extremities which is spreading.  She has several unexplained bruises on her arms.  No falls or injury.  She is not on anticoagulants."

## 2023-03-08 NOTE — H&P (Signed)
History and Physical    Cindy [name] ZOX:[id number] DOB: 05-Dec-1985 DOA: 03/08/2023  Referring MD/NP/PA:   PCP: Patient, No Pcp Per   Patient coming from:  The patient is coming from home.     Chief Complaint: rash and bruises  HPI: Cindy [name] is a 37 y.o. female with medical history significant of bipolar disorder, anxiety, anemia, GERD, migraine, diverticulitis, s/p of bariatric surgery, who presents with rash.  Patient states that she has petechial rash and unexplained bruising for 5 days.  The rash is mainly on lower extremities. She has several unexplained bruises on her arms.  No falls or injury. She also had nosebleeding which has stopped now. She is not on anticoagulants.  Denies rectal bleeding or dark stool. No gingival bleeding. She just had dental procedure recently without any issues.  Patient does not have chest pain, cough, shortness of breath.  No fever or chills.  Denies nausea, vomiting, diarrhea or abdominal pain.  No symptoms of UTI.  Data reviewed independently and ED Course: pt was found to have platelet 6, WBC 10.5, hemoglobin 12.6, GFR> 60, temperature normal, blood pressure 107/68, heart rate 60, RR 18, oxygen saturation 97% on room air.  Patient is placed on MedSurg bed for observation.  Dr. Cathie Hoops of hematology is consulted.   EKG:  Not done in ED, will get one.     Review of Systems:   General: no fevers, chills, no body weight gain, fatigue HEENT: no blurry vision, hearing changes or sore throat. Had nose bleeding Respiratory: no dyspnea, coughing, wheezing CV: no chest pain, no palpitations GI: no nausea, vomiting, abdominal pain, diarrhea, constipation GU: no dysuria, burning on urination, increased urinary frequency, hematuria  Ext: no leg edema Neuro: no unilateral weakness, numbness, or tingling, no vision change or hearing loss Skin: has rash and bruises MSK: No muscle spasm, no deformity, no limitation of range of movement in spin Heme: No easy  bruising.  Travel history: No recent long distant travel.   Allergy:  Allergies  Allergen Reactions   Cefaclor     REACTION: Hives    Past Medical History:  Diagnosis Date   Bipolar disorder (HCC)    Depression    History of chicken pox    around age 63   History of colonic diverticulitis    once at age 70   History of hay fever    History of heartburn    and GERD sometimes    History of high blood pressure    History of migraine    History of UTI    Obesity     Past Surgical History:  Procedure Laterality Date   GASTRIC BYPASS  12/2008   LEAP     TONSILLECTOMY AND ADENOIDECTOMY  1991    Social History:  reports that she has never smoked. She has never used smokeless tobacco. She reports current alcohol use. She reports that she does not use drugs.  Family History:  Family History  Problem Relation Age of Onset   Schizophrenia Mother    Bipolar disorder Mother    Alcohol abuse Maternal Grandfather    Arthritis Mother        in her back   Heart disease Paternal Grandmother    High blood pressure Father    Diabetes Paternal Aunt      Prior to Admission medications   Medication Sig Start Date End Date Taking? Authorizing Provider  ALPRAZolam Prudy Feeler) 1 MG tablet Take 1 mg by  mouth 3 (three) times daily as needed for anxiety. Patient not taking: Reported on 03/08/2023    [provider]  alum & mag hydroxide-simeth (MAALOX PLUS) 400-400-40 MG/5ML suspension Take 15 mLs by mouth every 6 (six) hours as needed for indigestion.    [provider]  calcium carbonate (OS-CAL - DOSED IN MG OF ELEMENTAL CALCIUM) 1250 MG tablet Take 1 tablet by mouth 2 (two) times daily with a meal.    [provider]  cetirizine (ZYRTEC) 10 MG tablet Take 10 mg by mouth daily.    [provider]  cholecalciferol (VITAMIN D) 1000 UNITS tablet Take 4,000 Units by mouth daily.    [provider]  DULoxetine (CYMBALTA) 60 MG capsule Take 1 capsule  (60 mg total) by mouth daily. Patient not taking: Reported on 03/08/2023 09/28/13   Beau Fanny, FNP  ferrous sulfate 325 (65 FE) MG tablet Take 325 mg by mouth 3 (three) times daily with meals. Patient not taking: Reported on 03/08/2023    [provider]  fluticasone (FLONASE) 50 MCG/ACT nasal spray Place 2 sprays into both nostrils daily. 11/25/13   Lorre Munroe, NP  lamoTRIgine (LAMICTAL) 150 MG tablet Take 1 tablet by mouth 2 (two) times daily.    [provider]  levocetirizine (XYZAL) 5 MG tablet Take 1 tablet (5 mg total) by mouth every evening. 11/25/13   Lorre Munroe, NP  levonorgestrel (MIRENA, 52 MG,) 20 MCG/DAY IUD Take 1 insert by intrauterine route. 03/19/17   [provider]  lithium carbonate (LITHOBID) 300 MG ER tablet Take 600 mg by mouth 2 (two) times daily. 01/26/23   [provider]  lurasidone (LATUDA) 40 MG TABS tablet Take 40 mg by mouth daily with breakfast. Patient not taking: Reported on 03/08/2023    [provider]  methocarbamol (ROBAXIN) 500 MG tablet Take 1 tablet (500 mg total) by mouth every 6 (six) hours as needed for muscle spasms. Patient not taking: Reported on 03/08/2023 09/28/13   Beau Fanny, FNP  metroNIDAZOLE (METROGEL) 0.75 % vaginal gel Place 1 Applicatorful vaginally 2 (two) times daily. Patient not taking: Reported on 03/08/2023 11/25/13   Lorre Munroe, NP  Multiple Vitamin (MULTIVITAMIN WITH MINERALS) TABS Take 1 tablet by mouth 3 (three) times daily.    [provider]  naltrexone (DEPADE) 50 MG tablet Take 50 mg by mouth daily. 02/15/23   [provider]  OLANZapine (ZYPREXA) 10 MG tablet Take 10 mg by mouth at bedtime.    [provider]  ondansetron (ZOFRAN ODT) 4 MG disintegrating tablet Take 1 tablet (4 mg total) by mouth every 6 (six) hours as needed for nausea. Patient not taking: Reported on 03/08/2023 04/04/13   Jones Skene, MD  pantoprazole (PROTONIX) 40 MG tablet  Take by mouth.    [provider]  traZODone (DESYREL) 50 MG tablet Take 50-100 mg by mouth at bedtime as needed for sleep. Patient not taking: Reported on 03/08/2023    [provider]    Physical Exam: Vitals:   03/08/23 1502 03/08/23 1647 03/08/23 1700 03/08/23 1714  BP:  (!) 121/90 107/82   Pulse:  66 63   Resp:  15 13   Temp:    98.7 F (37.1 C)  TempSrc:    Oral  SpO2:  100% 100%   Weight: 70.3 kg     Height: 5\' 10"  (1.778 m)      General: Not in acute distress HEENT:  Eyes: PERRL, EOMI, no jaundice       ENT: No discharge from the ears and nose, no pharynx injection, no tonsillar enlargement.        Neck: No JVD, no bruit, no mass felt. Heme: No neck lymph node enlargement. Cardiac: S1/S2, RRR, No murmurs, No gallops or rubs. Respiratory: No rales, wheezing, rhonchi or rubs. GI: Soft, nondistended, nontender, no rebound pain, no organomegaly, BS present. GU: No hematuria Ext: No pitting leg edema bilaterally. 1+DP/PT pulse bilaterally. Musculoskeletal: No joint deformities, No joint redness or warmth, no limitation of ROM in spin. Skin: Has petechial rash mainly in legs, and multiple bruises in both arms. Neuro: Alert, oriented X3, cranial nerves II-XII grossly intact, moves all extremities normally.  Psych: Patient is not psychotic, no suicidal or hemocidal ideation.  Labs on Admission: I have personally reviewed following labs and imaging studies  CBC: Recent Labs  Lab 03/08/23 1506 03/08/23 1641  WBC 10.5  --   NEUTROABS 6.7  --   HGB 12.6  --   HCT 39.9  --   MCV 88.5  --   PLT 6* <5*   Basic Metabolic Panel: Recent Labs  Lab 03/08/23 1506  NA 138  K 4.0  CL 106  CO2 24  GLUCOSE 93  BUN 8  CREATININE 0.87  CALCIUM 9.3   GFR: Estimated Creatinine Clearance: 94.8 mL/min (by C-G formula based on SCr of 0.87 mg/dL). Liver Function Tests: Recent Labs  Lab 03/08/23 1506  AST 15  ALT 21  ALKPHOS 88  BILITOT 0.7  PROT 6.9   ALBUMIN 4.4   No results for input(s): "LIPASE", "AMYLASE" in the last 168 hours. No results for input(s): "AMMONIA" in the last 168 hours. Coagulation Profile: Recent Labs  Lab 03/08/23 1506  INR 1.2   Cardiac Enzymes: No results for input(s): "CKTOTAL", "CKMB", "CKMBINDEX", "TROPONINI" in the last 168 hours. BNP (last 3 results) No results for input(s): "PROBNP" in the last 8760 hours. HbA1C: No results for input(s): "HGBA1C" in the last 72 hours. CBG: No results for input(s): "GLUCAP" in the last 168 hours. Lipid Profile: No results for input(s): "CHOL", "HDL", "LDLCALC", "TRIG", "CHOLHDL", "LDLDIRECT" in the last 72 hours. Thyroid Function Tests: No results for input(s): "TSH", "T4TOTAL", "FREET4", "T3FREE", "THYROIDAB" in the last 72 hours. Anemia Panel: Recent Labs    03/08/23 1641  RETICCTPCT 1.4   Urine analysis:    Component Value Date/Time   COLORURINE YELLOW 04/04/2013 0046   APPEARANCEUR CLEAR 04/04/2013 0046   LABSPEC 1.015 04/04/2013 0046   PHURINE 5.5 04/04/2013 0046   GLUCOSEU NEGATIVE 04/04/2013 0046   HGBUR TRACE (A) 04/04/2013 0046   BILIRUBINUR neg 11/25/2013 1343   KETONESUR NEGATIVE 04/04/2013 0046   PROTEINUR normal 11/25/2013 1343   PROTEINUR NEGATIVE 04/04/2013 0046   UROBILINOGEN 0.2 11/25/2013 1343   UROBILINOGEN 0.2 04/04/2013 0046   NITRITE neg 11/25/2013 1343   NITRITE NEGATIVE 04/04/2013 0046   LEUKOCYTESUR moderate (2+) 11/25/2013 1343   Sepsis Labs: @LABRCNTIP (procalcitonin:4,lacticidven:4) )No results found for this or any previous visit (from the past 240 hour(s)).   Radiological Exams on Admission: No results found.    Assessment/Plan Principal Problem:   Idiopathic thrombocytopenic purpura (ITP) (HCC) Active Problems:   ANXIETY   Bipolar disorder (HCC)   Palpitation   GERD (gastroesophageal reflux disease)   Assessment and Plan:   Idiopathic thrombocytopenic purpura (ITP) (HCC): pt has petechial rash and  platelet of 6, possibly due to ITP. Consulted Dr. Cathie Hoops of hematology.  -  will place in med-surg bed for obs -transfuse 2 units of platelet -started Solu-Medrol 80 mg daily -f/up lab: cbc, haptoglobin, 2 platelet, LDH, reticulocyte -Peripheral smear -US-abd to r/o some renal Magrinat per Dr. Cathie Hoops  ANXIETY and Bipolar disorder [facility]): Patient has history of supratherapeutic level for lithium 1.5 month ago.  Her lithium dose was decreased from 450 to 300 mg twice daily per pt . Her lithium level is 0.69 which is therapeutic. -continue lithium -Abilify, Lamictal, olanzapine  Hx of Palpitation: Heart rate 60 -Continue home propranolol  GERD: -Protonix      DVT ppx: SCD  Code Status: Full code    Family Communication:   Yes, patient's parents  at bed side.  Disposition Plan:  Anticipate discharge back to previous environment  Consults called:  Dr. Cathie Hoops of hematology  Admission status and Level of care: Med-Surg:  for obs    Dispo: The patient is from: Home              Anticipated d/c is to: Home              Anticipated d/c date is: 1 day              Patient currently is not medically stable to d/c.    Severity of Illness:  The appropriate patient status for this patient is OBSERVATION. Observation status is judged to be reasonable and necessary in order to provide the required intensity of service to ensure the patient's safety. The patient's presenting symptoms, physical exam findings, and initial radiographic and laboratory data in the context of their medical condition is felt to place them at decreased risk for further clinical deterioration. Furthermore, it is anticipated that the patient will be medically stable for discharge from the hospital within 2 midnights of admission.        Date of Service 03/08/2023    Lorretta Harp [facility]   If 7PM-7AM, please contact night-coverage [email or web address] 03/08/2023, 5:50 PM

## 2023-03-08 NOTE — Discharge Instructions (Signed)
Go to the emergency department for the petechial rash and bruising.

## 2023-03-08 NOTE — ED Notes (Signed)
Pt's family at nurses station stating that pt has had headache. States that the pain is on the anterior aspect of her head. Pt given Tylenol at this time. Dr. Roxan Hockey, EDP made aware.

## 2023-03-08 NOTE — ED Triage Notes (Addendum)
Patient to Urgent Care with complaints of petechial rash present to bilateral legs and left wrist that appeared five days ago.   Patient also has multiple large bruises to her arms and legs with no known cause or injury. These also appeared on Wednesday.   Denies any other symptoms.

## 2023-03-08 NOTE — ED Notes (Signed)
Pt coming from UC, pt started having bruising and petechial rash to the bilateral lower legs. States she noticed it on Thursday. Denies any fever. Denies any pain. Denies any bleeding disorders. Pt is A&Ox4 and NAD

## 2023-03-08 NOTE — ED Provider Notes (Signed)
[facility] Provider Note    Event Date/Time   First MD Initiated Contact with Patient 03/08/23 1617     (approximate)   History   Rash (Note from UC "Petechial rash and unexplained bruising x 5 days.  Patient has petechial rash on her lower extremities which is spreading.  She has several unexplained bruises on her arms.  No falls or injury.  She is not on anticoagulants.")   HPI  ALYNN HIPKE is a 37 y.o. female psychiatric past medical history on lithium olanzapine, aripiprazole presents to the ER for evaluation of rash that she noted as well as easy bruising of her 5 days ago.  Has not noted any GI bleeding or melena.  No gingival bleeding just had dental procedure recently without any issues.  Has never had issues with bleeding disorder previously.  No fevers.     Physical Exam   Triage Vital Signs: ED Triage Vitals [03/08/23 1502]  Enc Vitals Group     BP      Pulse      Resp      Temp      Temp src      SpO2      Weight 155 lb (70.3 kg)     Height 5\' 10"  (1.778 m)     Head Circumference      Peak Flow      Pain Score 0     Pain Loc      Pain Edu?      Excl. in GC?     Most recent vital signs: Vitals:   03/08/23 1714 03/08/23 1807  BP:  134/79  Pulse:  60  Resp:  18  Temp: 98.7 F (37.1 C) 98.3 F (36.8 C)  SpO2:  100%     Constitutional: Alert  Eyes: Conjunctivae are normal.  Head: Atraumatic. Nose: No congestion/rhinnorhea. Mouth/Throat: Mucous membranes are moist.   Neck: Painless ROM.  Cardiovascular:   Good peripheral circulation. Respiratory: Normal respiratory effort.  No retractions.  Gastrointestinal: Soft and nontender.  Musculoskeletal:  no deformity Neurologic:  MAE spontaneously. No gross focal neurologic deficits are appreciated.  Skin:  Skin is warm, dry and intact.  Diffuse petechial rash of the lower extremities with some scattered contusions of varying age. Psychiatric: Mood and affect are normal. Speech  and behavior are normal.    ED Results / Procedures / Treatments   Labs (all labs ordered are listed, but only abnormal results are displayed) Labs Reviewed  CBC WITH DIFFERENTIAL/PLATELET - Abnormal; Notable for the following components:      Result Value   Platelets 6 (*)    All other components within normal limits  PROTIME-INR - Abnormal; Notable for the following components:   Prothrombin Time 15.4 (*)    All other components within normal limits  IMMATURE PLATELET FRACTION - Abnormal; Notable for the following components:   Immature Platelet Fraction 36.7 (*)    All other components within normal limits  PLATELET COUNT - Abnormal; Notable for the following components:   Platelets <5 (*)    All other components within normal limits  BASIC METABOLIC PANEL  APTT  HEPATIC FUNCTION PANEL  LITHIUM LEVEL  LACTATE DEHYDROGENASE  RETICULOCYTES  TECHNOLOGIST SMEAR REVIEW  HAPTOGLOBIN  PATHOLOGIST SMEAR REVIEW  BASIC METABOLIC PANEL  CBC WITH DIFFERENTIAL/PLATELET  HIV ANTIBODY (ROUTINE TESTING W REFLEX)  TYPE AND SCREEN  PREPARE PLATELET PHERESIS  ABO/RH     EKG  RADIOLOGY    PROCEDURES:  Critical Care performed: Yes, see critical care procedure note(s)  .Critical Care  Performed by: Willy Eddy, MD Authorized by: Willy Eddy, MD   Critical care provider statement:    Critical care time (minutes):  35   Critical care was necessary to treat or prevent imminent or life-threatening deterioration of the following conditions:  Circulatory failure   Critical care was time spent personally by me on the following activities:  Ordering and performing treatments and interventions, ordering and review of laboratory studies, ordering and review of radiographic studies, pulse oximetry, re-evaluation of patient's condition, review of old charts, obtaining history from patient or surrogate, examination of patient, evaluation of patient's response to treatment,  discussions with primary provider, discussions with consultants and development of treatment plan with patient or surrogate    MEDICATIONS ORDERED IN ED: Medications  0.9 %  sodium chloride infusion (0 mL/hr Intravenous Hold 03/08/23 1758)  ondansetron (ZOFRAN) injection 4 mg (has no administration in time range)  acetaminophen (TYLENOL) tablet 650 mg (has no administration in time range)  propranolol (INDERAL) tablet 30 mg (has no administration in time range)  ARIPiprazole (ABILIFY) tablet 10 mg (has no administration in time range)  OLANZapine (ZYPREXA) tablet 10 mg (has no administration in time range)  naltrexone (DEPADE) tablet 50 mg (has no administration in time range)  lamoTRIgine (LAMICTAL) tablet 150 mg (has no administration in time range)  multivitamin with minerals tablet 1 tablet (has no administration in time range)  methylPREDNISolone sodium succinate (SOLU-MEDROL) 125 mg/2 mL injection 80 mg (has no administration in time range)  pantoprazole (PROTONIX) EC tablet 40 mg (has no administration in time range)  lithium carbonate (LITHOBID) ER tablet 300 mg (has no administration in time range)     IMPRESSION / MDM / ASSESSMENT AND PLAN / ED COURSE  I reviewed the triage vital signs and the nursing notes.                              Differential diagnosis includes, but is not limited to, TTP, HUS, ITP, malignancy, medication effect, acute blood loss anemia  Patient presenting to the ER for evaluation of symptoms as described above.  Based on symptoms, risk factors and considered above differential, this presenting complaint could reflect a potentially life-threatening illness therefore the patient will be placed on continuous pulse oximetry and telemetry for monitoring.  Laboratory evaluation will be sent to evaluate for the above complaints.  Patient with evidence of acute thrombocytopenia.  No anemia.  Bilirubin is normal.  No schistocytes.  Platelet morphology normal.  INR  normal.  Given these findings have a lower suspicion for TTP given her well appearance seems more likely ITP.  Plasmic score of 3 placing her in the low risk group.   Clinical Course as of 03/08/23 Jamal Collin Mar 08, 2023  1638 I have consulted with Dr. Cathie Hoops of hematology who will review patient's labs. [PR]  1659 Case discussed in consultation with hospitalist for admission.  Patient remained stable and appropriate for admission. [PR]    Clinical Course User Index [PR] Willy Eddy, MD     FINAL CLINICAL IMPRESSION(S) / ED DIAGNOSES   Final diagnoses:  Thrombocytopenia (HCC)  Petechiae     Rx / DC Orders   ED Discharge Orders     None        Note:  This document was prepared using Dragon voice recognition  software and may include unintentional dictation errors.    Willy Eddy, MD 03/08/23 [phone]

## 2023-03-09 DIAGNOSIS — Z8261 Family history of arthritis: Secondary | ICD-10-CM | POA: Diagnosis not present

## 2023-03-09 DIAGNOSIS — R002 Palpitations: Secondary | ICD-10-CM | POA: Diagnosis present

## 2023-03-09 DIAGNOSIS — R21 Rash and other nonspecific skin eruption: Secondary | ICD-10-CM | POA: Diagnosis present

## 2023-03-09 DIAGNOSIS — Z888 Allergy status to other drugs, medicaments and biological substances status: Secondary | ICD-10-CM | POA: Diagnosis not present

## 2023-03-09 DIAGNOSIS — K219 Gastro-esophageal reflux disease without esophagitis: Secondary | ICD-10-CM | POA: Diagnosis present

## 2023-03-09 DIAGNOSIS — Z79899 Other long term (current) drug therapy: Secondary | ICD-10-CM | POA: Diagnosis not present

## 2023-03-09 DIAGNOSIS — D693 Immune thrombocytopenic purpura: Secondary | ICD-10-CM | POA: Diagnosis present

## 2023-03-09 DIAGNOSIS — R233 Spontaneous ecchymoses: Secondary | ICD-10-CM

## 2023-03-09 DIAGNOSIS — Z23 Encounter for immunization: Secondary | ICD-10-CM | POA: Diagnosis not present

## 2023-03-09 DIAGNOSIS — F319 Bipolar disorder, unspecified: Secondary | ICD-10-CM | POA: Diagnosis present

## 2023-03-09 DIAGNOSIS — D696 Thrombocytopenia, unspecified: Secondary | ICD-10-CM

## 2023-03-09 DIAGNOSIS — F32A Depression, unspecified: Secondary | ICD-10-CM | POA: Diagnosis present

## 2023-03-09 DIAGNOSIS — Z9884 Bariatric surgery status: Secondary | ICD-10-CM | POA: Diagnosis not present

## 2023-03-09 DIAGNOSIS — Z975 Presence of (intrauterine) contraceptive device: Secondary | ICD-10-CM | POA: Diagnosis not present

## 2023-03-09 DIAGNOSIS — F419 Anxiety disorder, unspecified: Secondary | ICD-10-CM | POA: Diagnosis present

## 2023-03-09 DIAGNOSIS — Z833 Family history of diabetes mellitus: Secondary | ICD-10-CM | POA: Diagnosis not present

## 2023-03-09 DIAGNOSIS — Z8249 Family history of ischemic heart disease and other diseases of the circulatory system: Secondary | ICD-10-CM | POA: Diagnosis not present

## 2023-03-09 DIAGNOSIS — Z87440 Personal history of urinary (tract) infections: Secondary | ICD-10-CM | POA: Diagnosis not present

## 2023-03-09 DIAGNOSIS — T380X5A Adverse effect of glucocorticoids and synthetic analogues, initial encounter: Secondary | ICD-10-CM | POA: Diagnosis present

## 2023-03-09 DIAGNOSIS — F3160 Bipolar disorder, current episode mixed, unspecified: Secondary | ICD-10-CM

## 2023-03-09 DIAGNOSIS — Z818 Family history of other mental and behavioral disorders: Secondary | ICD-10-CM | POA: Diagnosis not present

## 2023-03-09 LAB — HIV ANTIBODY (ROUTINE TESTING W REFLEX): HIV Screen 4th Generation wRfx: NONREACTIVE

## 2023-03-09 LAB — BASIC METABOLIC PANEL
Anion gap: 8 (ref 5–15)
BUN: 11 mg/dL (ref 6–20)
CO2: 22 mmol/L (ref 22–32)
Calcium: 9.7 mg/dL (ref 8.9–10.3)
Chloride: 109 mmol/L (ref 98–111)
Creatinine, Ser: 0.78 mg/dL (ref 0.44–1.00)
GFR, Estimated: >60 mL/min (ref >60)
Glucose, Bld: 177 mg/dL — ABNORMAL HIGH (ref 70–99)
Potassium: 4.2 mmol/L (ref 3.5–5.1)
Sodium: 139 mmol/L (ref 135–145)

## 2023-03-09 LAB — PATHOLOGIST SMEAR REVIEW

## 2023-03-09 LAB — CBC WITH DIFFERENTIAL/PLATELET
Abs Immature Granulocytes: 0.05 10*3/uL (ref 0.00–0.07)
Basophils Absolute: 0 10*3/uL (ref 0.0–0.1)
Basophils Relative: 0 %
Eosinophils Absolute: 0 10*3/uL (ref 0.0–0.5)
Eosinophils Relative: 0 %
HCT: 37 % (ref 36.0–46.0)
Hemoglobin: 12.1 g/dL (ref 12.0–15.0)
Immature Granulocytes: 1 %
Lymphocytes Relative: 7 %
Lymphs Abs: 0.6 10*3/uL — ABNORMAL LOW (ref 0.7–4.0)
MCH: 27.8 pg (ref 26.0–34.0)
MCHC: 32.7 g/dL (ref 30.0–36.0)
MCV: 85.1 fL (ref 80.0–100.0)
Monocytes Absolute: 0.1 10*3/uL (ref 0.1–1.0)
Monocytes Relative: 1 %
Neutro Abs: 7.1 10*3/uL (ref 1.7–7.7)
Neutrophils Relative %: 91 %
Platelets: 19 10*3/uL — CL (ref 150–400)
RBC: 4.35 MIL/uL (ref 3.87–5.11)
RDW: 13.2 % (ref 11.5–15.5)
WBC: 7.9 10*3/uL (ref 4.0–10.5)
nRBC: 0 % (ref 0.0–0.2)

## 2023-03-09 LAB — RHOGAM INJECTION

## 2023-03-09 LAB — VITAMIN B12: Vitamin B-12: 383 pg/mL (ref 180–914)

## 2023-03-09 LAB — PREPARE PLATELET PHERESIS

## 2023-03-09 LAB — BPAM PLATELET PHERESIS: Blood Product Expiration Date: 202406192359

## 2023-03-09 LAB — FOLATE: Folate: 12.6 ng/mL (ref >5.9)

## 2023-03-09 MED ORDER — DIPHENHYDRAMINE HCL 25 MG PO CAPS
25.0000 mg | ORAL_CAPSULE | Freq: Every evening | ORAL | Status: DC | PRN
  Administered 2023-03-09 (×2): 25 mg via ORAL
  Filled 2023-03-09 (×2): qty 1

## 2023-03-09 MED ORDER — DEXAMETHASONE SODIUM PHOSPHATE 100 MG/10ML IJ SOLN
40.0000 mg | Freq: Every day | INTRAMUSCULAR | Status: DC
  Administered 2023-03-09 – 2023-03-10 (×2): 40 mg via INTRAVENOUS
  Filled 2023-03-09 (×2): qty 4
  Filled 2023-03-09: qty 10

## 2023-03-09 MED ORDER — IMMUNE GLOBULIN (HUMAN) 10 GM/100ML IV SOLN
1.0000 g/kg | INTRAVENOUS | Status: AC
  Administered 2023-03-09 – 2023-03-10 (×2): 70 g via INTRAVENOUS
  Filled 2023-03-09 (×3): qty 700

## 2023-03-09 MED ORDER — RHO D IMMUNE GLOBULIN 1500 UNIT/2ML IJ SOSY
300.0000 ug | PREFILLED_SYRINGE | Freq: Once | INTRAMUSCULAR | Status: AC
  Administered 2023-03-09: 300 ug via INTRAMUSCULAR
  Filled 2023-03-09: qty 2

## 2023-03-09 MED ORDER — DIPHENHYDRAMINE HCL 25 MG PO CAPS: 50.0000 mg | ORAL_CAPSULE | Freq: Every day | ORAL | Status: DC | PRN

## 2023-03-09 MED ORDER — ACETAMINOPHEN 325 MG PO TABS
650.0000 mg | ORAL_TABLET | Freq: Every day | ORAL | Status: DC | PRN
  Filled 2023-03-09: qty 2

## 2023-03-09 NOTE — Progress Notes (Signed)
  Progress Note   Cindy [name]: Cindy [name] QMV:[id number] DOB: [birth year] DOA: 03/08/2023     0 DOS: the Cindy [name] was seen and examined on 03/09/2023   Brief hospital course: Cindy [name] is a 37 y.o. female with medical history significant of bipolar disorder, anxiety, anemia, GERD, migraine, diverticulitis, s/p of bariatric surgery, who presents with rash.  Cindy [name]'s platelet count noted to be 6, admitted to hospitalist service for further management with hematology consultation.  Assessment and Plan: Idiopathic thrombocytopenic purpura- Unknown etiology at this time. Hematology consultation appreciated. B12, folate and low cytometry ordered Cindy [name] is started on Decadron 40 mg daily for 4 days. IVIG 1 mg/kg for 2 days ordered by Dr. Cathie Hoops  Bipolar disorder- Continue lithium, Abilify, Lamictal, olanzapine.  History of palpitations- Asymptomatic. Continue propranolol.  Continue daily PPI. Nursing supportive care. DVT prophylaxis with SCDs. CODE STATUS full code.      Subjective: Cindy [name] is seen and examined today morning.  She is laying comfortably.  Denies any other complaints other than rash.  She has purpuric rash, bruises or extremities noted.  Physical Exam: Vitals:   03/09/23 0838 03/09/23 1121 03/09/23 1148 03/09/23 1218  BP: 112/72 109/76 100/70 105/74  Pulse: 63 70 63 (!) 59  Resp: 14 15 15 14   Temp: 98.3 F (36.8 C) 98.2 F (36.8 C)    TempSrc:      SpO2: 95% 100% 99% 100%  Weight:      Height:       General -laying Caucasian female, no apparent distress HEENT - PERRLA, EOMI, atraumatic head, non tender sinuses. Lung - Clear, rales, rhonchi, wheezes. Heart - S1, S2 heard, no murmurs, rubs, no pedal edema Neuro - Alert, awake and oriented x 3, non focal exam. Skin - Warm and dry.  Purpuric rash over extremities noted, bruises noted to chest Data Reviewed:  CBC platelets 19, BMP  Family Communication: Cindy [name] and her mother understand and agree with the above  plan  Disposition: Status is: Inpatient Observation status changed to inpatient given her low platelet count and further workup, hematology oncology management  Planned Discharge Destination: Home    Time spent: 46 minutes  Author: Marcelino Duster, MD 03/09/2023 2:19 PM  For on call review [email or web address].

## 2023-03-09 NOTE — Consult Note (Signed)
Hematology/Oncology Consult note [phone] [phone]) [phone]      Patient Care Team: Patient, No Pcp Per as PCP - General (General Practice)   Name of the patient: Cindy [name]  [id number]  1986/07/03   REASON FOR COSULTATION:   History of presenting illness-  37 y.o. female with PMH listed at below who presents to ER for evaluation of skin rash.  She noticed petechial rash on her hands and lower extremities for 5 days.  Also noticed some bruises on her arms.  She denies any recent new medication, URI, Denies hematochezia, hematuria, hematemesis, epistaxis, black tarry stool. She has a history of bariatric surgery.  In ER, she was found to have platelet count of 6000, a repeat platelet count of <5000.  Normal platelet count of 296,000 in Feb 2024 She was admitted for further evaluation. Additional blood work showed normal LDH, normal bilirubin, no reticulocytosis.  I have discussed with ER physician and admitting phyisican Dr. Clyde Lundborg.  Likely ITP, she was transfused with platelet due to count is <10,000.  Started on Solumedrol 1mg /kg yesterday.  Today her platelet count was 19,000. No bleeding events.  She reports feeling well.     Allergies  Allergen Reactions   Cefaclor     REACTION: Hives    Patient Active Problem List   Diagnosis Date Noted   Thrombocytopenia (HCC) 03/09/2023   Idiopathic thrombocytopenic purpura (ITP) (HCC) 03/08/2023   Bipolar disorder (HCC) 03/08/2023   GERD (gastroesophageal reflux disease) 03/08/2023   Palpitation 03/08/2023   Leukocytosis, unspecified 02/24/2014   Severe recurrent major depression without psychotic features (HCC) 09/23/2013   Acne 04/27/2013   Anemia, iron deficiency 04/27/2013   Bariatric surgery status 04/27/2013   B12 deficiency 04/27/2013   Unspecified vitamin D deficiency 04/27/2013   ANXIETY 11/02/2008     Past Medical History:  Diagnosis Date   Bipolar disorder (HCC)    Depression    History of  chicken pox    around age 57   History of colonic diverticulitis    once at age 70   History of hay fever    History of heartburn    and GERD sometimes    History of high blood pressure    History of migraine    History of UTI    Obesity      Past Surgical History:  Procedure Laterality Date   GASTRIC BYPASS  12/2008   LEAP     TONSILLECTOMY AND ADENOIDECTOMY  1991    Social History   Socioeconomic History   Marital status: Divorced    Spouse name: Not on file   Number of children: Not on file   Years of education: Not on file   Highest education level: Not on file  Occupational History   Not on file  Tobacco Use   Smoking status: Never   Smokeless tobacco: Never  Substance and Sexual Activity   Alcohol use: Yes    Comment: occ   Drug use: No   Sexual activity: Not on file  Other Topics Concern   Not on file  Social History Narrative   Not on file   Social Determinants of Health   Financial Resource Strain: Not on file  Food Insecurity: No Food Insecurity (03/09/2023)   Hunger Vital Sign    Worried About Running Out of Food in the Last Year: Never true    Ran Out of Food in the Last Year: Never true  Transportation Needs: No Transportation Needs (03/09/2023)  PRAPARE - Administrator, Civil Service (Medical): No    Lack of Transportation (Non-Medical): No  Physical Activity: Not on file  Stress: Not on file  Social Connections: Not on file  Intimate Partner Violence: Not At Risk (03/09/2023)   Humiliation, Afraid, Rape, and Kick questionnaire    Fear of Current or Ex-Partner: No    Emotionally Abused: No    Physically Abused: No    Sexually Abused: No     Family History  Problem Relation Age of Onset   Schizophrenia Mother    Bipolar disorder Mother    Alcohol abuse Maternal Grandfather    Arthritis Mother        in her back   Heart disease Paternal Grandmother    High blood pressure Father    Diabetes Paternal Aunt      Current  Facility-Administered Medications:    0.9 %  sodium chloride infusion, 10 mL/hr, Intravenous, Once, Willy Eddy, MD, Held at 03/08/23 1758   acetaminophen (TYLENOL) tablet 650 mg, 650 mg, Oral, Q6H PRN, Lorretta Harp, MD, 650 mg at 03/08/23 1827   acetaminophen (TYLENOL) tablet 650 mg, 650 mg, Oral, Daily PRN, [name], MD   ARIPiprazole (ABILIFY) tablet 10 mg, 10 mg, Oral, q AM, Lorretta Harp, MD, 10 mg at 03/09/23 0644   dexamethasone (DECADRON) injection 40 mg, 40 mg, Intravenous, Daily, [name], MD, 40 mg at 03/09/23 1050   diphenhydrAMINE (BENADRYL) capsule 25 mg, 25 mg, Oral, QHS PRN, Mansy, Jan A, MD, 25 mg at 03/09/23 0345   diphenhydrAMINE (BENADRYL) capsule 50 mg, 50 mg, Oral, Daily PRN, [name], MD   Immune Globulin 10% (PRIVIGEN) IV infusion 70 g, 1 g/kg (Ideal), Intravenous, Q24 Hr x 2, [name], MD   lamoTRIgine (LAMICTAL) tablet 150 mg, 150 mg, Oral, BID, Lorretta Harp, MD, 150 mg at 03/09/23 1051   lithium carbonate (LITHOBID) ER tablet 300 mg, 300 mg, Oral, BID, Lorretta Harp, MD, 300 mg at 03/09/23 1051   multivitamin with minerals tablet 1 tablet, 1 tablet, Oral, Daily, Lorretta Harp, MD, 1 tablet at 03/09/23 1051   naltrexone (DEPADE) tablet 50 mg, 50 mg, Oral, QHS, Lorretta Harp, MD, 50 mg at 03/08/23 2126   OLANZapine (ZYPREXA) tablet 10 mg, 10 mg, Oral, QHS, Lorretta Harp, MD, 10 mg at 03/08/23 2127   ondansetron (ZOFRAN) injection 4 mg, 4 mg, Intravenous, Q8H PRN, Lorretta Harp, MD   pantoprazole (PROTONIX) EC tablet 40 mg, 40 mg, Oral, BID, Foye Deer, RPH, 40 mg at 03/09/23 1050   propranolol (INDERAL) tablet 30 mg, 30 mg, Oral, BID, Lorretta Harp, MD, 30 mg at 03/09/23 1051  Review of Systems  Constitutional:  Negative for appetite change, chills, fatigue and fever.  HENT:   Negative for hearing loss and voice change.   Eyes:  Negative for eye problems.  Respiratory:  Negative for chest tightness and cough.   Cardiovascular:  Negative for chest pain.  Gastrointestinal:  Negative  for abdominal distention, abdominal pain and blood in stool.  Endocrine: Negative for hot flashes.  Genitourinary:  Negative for difficulty urinating and frequency.   Musculoskeletal:  Negative for arthralgias.  Skin:  Positive for rash. Negative for itching.  Neurological:  Negative for extremity weakness.  Hematological:  Negative for adenopathy. Bruises/bleeds easily.  Psychiatric/Behavioral:  Negative for confusion.     PHYSICAL EXAM Vitals:   03/09/23 0130 03/09/23 0153 03/09/23 0251 03/09/23 0838  BP: 99/68  103/72 112/72  Pulse: 68  64  63  Resp: 19  16 14   Temp:  98.2 F (36.8 C) 98 F (36.7 C) 98.3 F (36.8 C)  TempSrc:  Oral    SpO2: 98%  98% 95%  Weight:      Height:       Physical Exam Constitutional:      General: She is not in acute distress.    Appearance: She is not diaphoretic.  HENT:     Head: Normocephalic and atraumatic.     Nose: Nose normal.     Mouth/Throat:     Pharynx: No oropharyngeal exudate.  Eyes:     General: No scleral icterus.    Pupils: Pupils are equal, round, and reactive to light.  Cardiovascular:     Rate and Rhythm: Normal rate and regular rhythm.     Heart sounds: No murmur heard. Pulmonary:     Effort: Pulmonary effort is normal. No respiratory distress.  Abdominal:     General: There is no distension.     Palpations: Abdomen is soft.     Tenderness: There is no abdominal tenderness.  Musculoskeletal:        General: Normal range of motion.     Cervical back: Normal range of motion and neck supple.  Skin:    General: Skin is warm and dry.     Findings: Rash present. No erythema.     Comments: Petechia rash on all four extremities.   Neurological:     Mental Status: She is alert and oriented to person, place, and time. Mental status is at baseline.     Cranial Nerves: No cranial nerve deficit.     Motor: No abnormal muscle tone.  Psychiatric:        Mood and Affect: Mood and affect normal.       LABORATORY  STUDIES    Latest Ref Rng & Units 03/09/2023    5:02 AM 03/08/2023    4:41 PM 03/08/2023    3:06 PM  CBC  WBC 4.0 - 10.5 K/uL 7.9   10.5   Hemoglobin 12.0 - 15.0 g/dL 16.1   09.6   Hematocrit 36.0 - 46.0 % 37.0   39.9   Platelets 150 - 400 K/uL 19  <5  6       Latest Ref Rng & Units 03/09/2023    5:02 AM 03/08/2023    3:06 PM 09/24/2013    6:34 AM  CMP  Glucose 70 - 99 mg/dL 045  93  97   BUN 6 - 20 mg/dL 11  8  9    Creatinine 0.44 - 1.00 mg/dL 4.09  8.11  9.14   Sodium 135 - 145 mmol/L 139  138  140   Potassium 3.5 - 5.1 mmol/L 4.2  4.0  4.0   Chloride 98 - 111 mmol/L 109  106  102   CO2 22 - 32 mmol/L 22  24  23    Calcium 8.9 - 10.3 mg/dL 9.7  9.3  9.5   Total Protein 6.5 - 8.1 g/dL  6.9  7.3   Total Bilirubin 0.3 - 1.2 mg/dL  0.7  0.3   Alkaline Phos 38 - 126 U/L  88  125   AST 15 - 41 U/L  15  27   ALT 0 - 44 U/L  21  52      RADIOGRAPHIC STUDIES: I have personally reviewed the radiological images as listed and agreed with the findings in the report. US Abdomen Complete  Result Date:  03/08/2023 CLINICAL DATA:  Idiopathic thrombocytopenia. EXAM: ABDOMEN ULTRASOUND COMPLETE COMPARISON:  None Available. FINDINGS: Gallbladder: No gallstones or wall thickening visualized. No sonographic Murphy sign noted by sonographer. Common bile duct: Diameter: 3 mm Liver: No focal lesion identified. Within normal limits in parenchymal echogenicity. Portal vein is patent on color Doppler imaging with normal direction of blood flow towards the liver. IVC: No abnormality visualized. Pancreas: Visualized portion unremarkable. Spleen: Size and appearance within normal limits. Right Kidney: Length: 10.2 cm. Echogenicity within normal limits. No mass or hydronephrosis visualized. Left Kidney: Length: 10.1 cm. Echogenicity within normal limits. No mass or hydronephrosis visualized. Abdominal aorta: No aneurysm visualized. Other findings: None. IMPRESSION: Unremarkable abdominal ultrasound. Electronically  Signed   By: Elgie Collard M.D.   On: 03/08/2023 19:16     Assessment and plan-   # Acute thrombocytopenia Smear showed no schistocytes. Normal LDH, bilirubin. Increase immature platelet fraction.  US showed no splenomegaly.  Likely ITP. Check B12, Folate, flowcytometry.  Continue steroid, I switch to Dexamethasone 40mg  daily. X 4 days.  Recommend IVIG 1mg /kg daily x 2.  Rationale and side effects were reviewed and patient agrees with the plan.  Monitor cbc daily.     Thank you for allowing me to participate in the care of this patient.   [name], MD, PhD Hematology Oncology 03/09/2023

## 2023-03-09 NOTE — Progress Notes (Signed)
Cindy [name] Visited pt and family. Pt expressed  a desire to complete the adv dir. Cindy [name] supplied the forms and education on completing the Living Will. Kindly Page the [place] as need arise.    03/09/23 1100  Spiritual Encounters  Type of Visit Initial  Care provided to: Pt and family  Referral source Patient request  Reason for visit Advance directives  OnCall Visit No  Spiritual Framework  Presenting Themes Coping tools;Courage hope and growth  Community/Connection Family  Interventions  Spiritual Care Interventions Made Established relationship of care and support;Decision-making support/facilitation  Intervention Outcomes  Outcomes Awareness of support;Connection to spiritual care  Spiritual Care Plan  Spiritual Care Issues Still Outstanding No further spiritual care needs at this time (see row info)

## 2023-03-09 NOTE — ED Notes (Signed)
ED TO INPATIENT HANDOFF REPORT  ED Nurse Name and Phone #: Farley Ly [phone]  S Name/Age/Gender Cindy [name] 37 y.o. female Room/Bed: ED34A/ED34A  Code Status   Code Status: Full Code  Home/SNF/Other Home Patient oriented to: self, place, time, and situation Is this baseline? Yes   Triage Complete: Triage complete  Chief Complaint Idiopathic thrombocytopenic purpura (ITP) (HCC) [D69.3]  Triage Note Note from UC "Petechial rash and unexplained bruising x 5 days.  Patient has petechial rash on her lower extremities which is spreading.  She has several unexplained bruises on her arms.  No falls or injury.  She is not on anticoagulants."   Allergies Allergies  Allergen Reactions   Cefaclor     REACTION: Hives    Level of Care/Admitting Diagnosis ED Disposition     ED Disposition  Admit   Condition  --   Comment  Hospital Area: [facility] [[id number]]  Level of Care: Med-Surg [16]  Covid Evaluation: Asymptomatic - no recent exposure (last 10 days) testing not required  Diagnosis: Idiopathic thrombocytopenic purpura (ITP) (HCC) [829562]  Admitting Physician: Lorretta Harp [[id number]]  Attending Physician: Lorretta Harp [[id number]]          B Medical/Surgery History Past Medical History:  Diagnosis Date   Bipolar disorder (HCC)    Depression    History of chicken pox    around age 73   History of colonic diverticulitis    once at age 35   History of hay fever    History of heartburn    and GERD sometimes    History of high blood pressure    History of migraine    History of UTI    Obesity    Past Surgical History:  Procedure Laterality Date   GASTRIC BYPASS  12/2008   LEAP     TONSILLECTOMY AND ADENOIDECTOMY  1991     A IV Location/Drains/Wounds Patient Lines/Drains/Airways Status     Active Line/Drains/Airways     Name Placement date Placement time Site Days   Peripheral IV 03/08/23 20 G 1.16" Left Antecubital 03/08/23  1715   Antecubital  1            Intake/Output Last 24 hours No intake or output data in the 24 hours ending 03/09/23 0150  Labs/Imaging Results for orders placed or performed during the hospital encounter of 03/08/23 (from the past 48 hour(s))  Type and screen [facility]     Status: None   Collection Time: 03/08/23  3:06 PM  Result Value Ref Range   ABO/RH(D) O NEG    Antibody Screen NEG    Sample Expiration      03/11/2023,2359 Performed at [facility], [address] [place], [place] [address]   CBC with Differential     Status: Abnormal   Collection Time: 03/08/23  3:06 PM  Result Value Ref Range   WBC 10.5 4.0 - 10.5 K/uL   RBC 4.51 3.87 - 5.11 MIL/uL   Hemoglobin 12.6 12.0 - 15.0 g/dL   HCT 57.8 46.9 - 62.9 %   MCV 88.5 80.0 - 100.0 fL   MCH 27.9 26.0 - 34.0 pg   MCHC 31.6 30.0 - 36.0 g/dL   RDW 52.8 41.3 - 24.4 %   Platelets 6 (LL) 150 - 400 K/uL    Comment: REPEATED TO VERIFY PLATELET COUNT CONFIRMED BY SMEAR THIS CRITICAL RESULT HAS VERIFIED AND BEEN CALLED TO ANGELA ROBBINS BY PAULA Shaketha FERREE ON  06 16 2024 AT 1547, AND HAS BEEN READ BACK.     nRBC 0.0 0.0 - 0.2 %   Neutrophils Relative % 64 %   Neutro Abs 6.7 1.7 - 7.7 K/uL   Lymphocytes Relative 26 %   Lymphs Abs 2.8 0.7 - 4.0 K/uL   Monocytes Relative 8 %   Monocytes Absolute 0.9 0.1 - 1.0 K/uL   Eosinophils Relative 0 %   Eosinophils Absolute 0.0 0.0 - 0.5 K/uL   Basophils Relative 1 %   Basophils Absolute 0.1 0.0 - 0.1 K/uL   WBC Morphology MORPHOLOGY UNREMARKABLE    RBC Morphology MORPHOLOGY UNREMARKABLE    Smear Review PLATELETS APPEAR DECREASED    Immature Granulocytes 1 %   Abs Immature Granulocytes 0.06 0.00 - 0.07 K/uL    Comment: Performed at [facility], [address] [place], [place] [address]  Basic metabolic panel     Status: None   Collection Time: 03/08/23  3:06 PM  Result Value Ref Range   Sodium 138 135 - 145 mmol/L   Potassium 4.0 3.5 -  5.1 mmol/L   Chloride 106 98 - 111 mmol/L   CO2 24 22 - 32 mmol/L   Glucose, Bld 93 70 - 99 mg/dL    Comment: Glucose reference range applies only to samples taken after fasting for at least 8 hours.   BUN 8 6 - 20 mg/dL   Creatinine, Ser 1.30 0.44 - 1.00 mg/dL   Calcium 9.3 8.9 - 86.5 mg/dL   GFR, Estimated >78 >46 mL/min    Comment: (NOTE) Calculated using the CKD-EPI Creatinine Equation (2021)    Anion gap 8 5 - 15    Comment: Performed at [facility], [address] [place], [place] [address]  Protime-INR     Status: Abnormal   Collection Time: 03/08/23  3:06 PM  Result Value Ref Range   Prothrombin Time 15.4 (H) 11.4 - 15.2 seconds   INR 1.2 0.8 - 1.2    Comment: (NOTE) INR goal varies based on device and disease states. Performed at [facility], [address] [place], [place] [address]   APTT     Status: None   Collection Time: 03/08/23  3:06 PM  Result Value Ref Range   aPTT 28 24 - 36 seconds    Comment: Performed at [facility], [address] [place], [place] [address]  Hepatic function panel     Status: None   Collection Time: 03/08/23  3:06 PM  Result Value Ref Range   Total Protein 6.9 6.5 - 8.1 g/dL   Albumin 4.4 3.5 - 5.0 g/dL   AST 15 15 - 41 U/L   ALT 21 0 - 44 U/L   Alkaline Phosphatase 88 38 - 126 U/L   Total Bilirubin 0.7 0.3 - 1.2 mg/dL   Bilirubin, Direct 0.1 0.0 - 0.2 mg/dL   Indirect Bilirubin 0.6 0.3 - 0.9 mg/dL    Comment: Performed at [facility], [address], [place], [place] [address]  Technologist smear review     Status: None   Collection Time: 03/08/23  3:06 PM  Result Value Ref Range   WBC MORPHOLOGY MORPHOLOGY UNREMARKABLE    RBC MORPHOLOGY MORPHOLOGY UNREMARKABLE     Comment: NO SCHISTOCYTES SEEN DISCUSSED CASE WITH DR LU, PATHOLOGIST ON CALL ON 03/08/23 AT 1650 QSD SUGGEST CLINICAL CORRELATION FOR FURTHER DIAGNOSTIC INFORMATION    Plt Morphology PLATELETS APPEAR DECREASED      Comment: RARE  LARGE PLATELET   Clinical Information THROMBOCYTOPENIA     Comment: Performed at [facility], [address] [place], [place] [address]  ABO/Rh     Status: None   Collection Time: 03/08/23  4:41 PM  Result Value Ref Range   ABO/RH(D)      [place] NEG Performed at [facility], [address] [place], [place] [address]   Lithium level     Status: None   Collection Time: 03/08/23  4:41 PM  Result Value Ref Range   Lithium Lvl 0.69 0.60 - 1.20 mmol/L    Comment: Performed at [facility], [address] [place], [place] [address]  Lactate dehydrogenase     Status: None   Collection Time: 03/08/23  4:41 PM  Result Value Ref Range   LDH 183 98 - 192 U/L    Comment: Performed at [facility], [address] [place], [place] [address]  Immature Platelet Fraction     Status: Abnormal   Collection Time: 03/08/23  4:41 PM  Result Value Ref Range   Immature Platelet Fraction 36.7 (H) 1.2 - 8.6 %    Comment:        An elevated IPF indicates increased platelet production. A low platelet count with an elevated IPF may be associated with peripheral platelet destruction (e.g. DIC, ITP) or bone marrow recovery (e.g. after chemotherapy or transplant). A low platelet count with a low or non- elevated IPF is consistent with a platelet production disorder. Performed at [facility], [address] [place], [place] [address]   Reticulocytes     Status: None   Collection Time: 03/08/23  4:41 PM  Result Value Ref Range   Retic Ct Pct 1.4 0.4 - 3.1 %   RBC. 4.36 3.87 - 5.11 MIL/uL   Retic Count, Absolute 62.3 19.0 - 186.0 K/uL   Immature Retic Fract 6.1 2.3 - 15.9 %    Comment: Performed at [facility], [address], [place], [place] [address]  Platelet count     Status: Abnormal   Collection Time: 03/08/23  4:41 PM  Result Value Ref Range   Platelets <5 (LL) 150 - 400 K/uL    Comment: Performed at [facility], [address] [place], [place] [address]  HIV Antibody (routine testing w rflx)     Status: None   Collection Time: 03/08/23  4:41 PM  Result Value Ref Range   HIV Screen 4th Generation wRfx Non Reactive Non Reactive    Comment: Performed at [facility], [address] [place], [place] [address]  Prepare platelet pheresis     Status: None (Preliminary result)   Collection Time: 03/08/23  5:00 PM  Result Value Ref Range   Unit Number [id number]    Blood Component Type PLTP2 PSORALEN TREATED    Unit division 00    Status of Unit ISSUED    Transfusion Status      OK TO TRANSFUSE Performed at [facility], [address] [place], [place] [address]    Unit Number [id number]    Blood Component Type PLTP1 PSORALEN TREATED    Unit division 00    Status of Unit ISSUED    Transfusion Status OK TO TRANSFUSE    US Abdomen Complete  Result Date: 03/08/2023 CLINICAL DATA:  Idiopathic thrombocytopenia. EXAM: ABDOMEN ULTRASOUND COMPLETE COMPARISON:  None Available. FINDINGS: Gallbladder: No gallstones or wall thickening visualized. No sonographic Murphy sign noted by sonographer. Common  bile duct: Diameter: 3 mm Liver: No focal lesion identified. Within normal limits in parenchymal echogenicity. Portal vein is patent on color Doppler imaging with normal direction of blood flow towards the liver. IVC: No abnormality visualized. Pancreas: Visualized portion unremarkable. Spleen: Size and appearance within normal limits. Right Kidney: Length: 10.2 cm. Echogenicity within normal limits. No mass or hydronephrosis visualized. Left Kidney: Length: 10.1 cm. Echogenicity within normal limits. No mass or hydronephrosis visualized. Abdominal aorta: No aneurysm visualized. Other findings: None. IMPRESSION: Unremarkable abdominal ultrasound. Electronically Signed   By: Elgie Collard M.D.   On: 03/08/2023 19:16    Pending Labs Unresulted Labs (From admission, onward)     Start      Ordered   03/09/23 0500  Basic metabolic panel  Tomorrow morning,   R        03/08/23 1703   03/09/23 0500  CBC with Differential/Platelet  Tomorrow morning,   R        03/08/23 1704   03/09/23 0500  Vitamin B12  Tomorrow morning,   R        03/08/23 2116   03/09/23 0500  Folate  Tomorrow morning,   R        03/08/23 2116   03/08/23 1638  Haptoglobin  Add-on,   AD        03/08/23 1637   03/08/23 1506  Pathologist smear review  Once,   R        03/08/23 1506            Vitals/Pain Today's Vitals   03/09/23 0000 03/09/23 0015 03/09/23 0100 03/09/23 0130  BP: 100/73 100/73 97/62 99/68   Pulse: 67 75 65 68  Resp: 18 19 20 19   Temp:  98.1 F (36.7 C)    TempSrc:  Oral    SpO2: 100%  98% 98%  Weight:      Height:      PainSc:        Isolation Precautions No active isolations  Medications Medications  0.9 %  sodium chloride infusion (0 mL/hr Intravenous Hold 03/08/23 1758)  ondansetron (ZOFRAN) injection 4 mg (has no administration in time range)  acetaminophen (TYLENOL) tablet 650 mg (650 mg Oral Given 03/08/23 1827)  propranolol (INDERAL) tablet 30 mg (30 mg Oral Given 03/08/23 2124)  ARIPiprazole (ABILIFY) tablet 10 mg (has no administration in time range)  OLANZapine (ZYPREXA) tablet 10 mg (10 mg Oral Given 03/08/23 2127)  naltrexone (DEPADE) tablet 50 mg (50 mg Oral Given 03/08/23 2126)  lamoTRIgine (LAMICTAL) tablet 150 mg (150 mg Oral Given 03/08/23 2127)  multivitamin with minerals tablet 1 tablet (1 tablet Oral Given 03/08/23 1827)  methylPREDNISolone sodium succinate (SOLU-MEDROL) 125 mg/2 mL injection 80 mg (80 mg Intravenous Given 03/08/23 2121)  pantoprazole (PROTONIX) EC tablet 40 mg (40 mg Oral Given 03/08/23 2124)  lithium carbonate (LITHOBID) ER tablet 300 mg (300 mg Oral Given 03/08/23 2128)    Mobility walks        R Recommendations: See Admitting Provider Note  Report given to:   Additional Notes:

## 2023-03-09 NOTE — TOC Progression Note (Signed)
Transition of Care [facility]) - Progression Note    Patient Details  Name: Cindy [name] MRN: [id number] Date of Birth: 1986-05-26  Transition of Care [facility]) CM/SW Contact  Marlowe Sax, RN Phone Number: 03/09/2023, 2:24 PM  Clinical Narrative:     Transition of Care [facility]) - Inpatient Brief Assessment   Patient Details  Name: Cindy [name] MRN: [id number] Date of Birth: 1985-12-14  Transition of Care [facility]) CM/SW Contact:    Marlowe Sax, RN Phone Number: 03/09/2023, 2:24 PM   Clinical Narrative:  Spoke with the patient She livers alone and is independent She has Pharmacologist Has PCP Can afford her medication   Transition of Care Asessment: Insurance and Status: Insurance coverage has been reviewed (she does have Community education officer) Patient has primary care physician: Yes Vicente Serene at [facility]) Home environment has been reviewed: lives alone with support of parents Prior level of function:: independent Prior/Current Home Services: No current home services Social Determinants of Health Reivew: SDOH reviewed no interventions necessary Readmission risk has been reviewed: No Transition of care needs: no transition of care needs at this time        Expected Discharge Plan and Services                                               Social Determinants of Health (SDOH) Interventions SDOH Screenings   Food Insecurity: No Food Insecurity (03/09/2023)  Housing: Low Risk  (03/09/2023)  Transportation Needs: No Transportation Needs (03/09/2023)  Utilities: Not At Risk (03/09/2023)  Tobacco Use: Low Risk  (03/08/2023)    Readmission Risk Interventions     No data to display

## 2023-03-09 NOTE — Plan of Care (Signed)

## 2023-03-09 NOTE — ED Notes (Signed)
Patient ambulatory to restroom with steady gait.

## 2023-03-10 ENCOUNTER — Encounter: Payer: Self-pay | Admitting: Internal Medicine

## 2023-03-10 DIAGNOSIS — D696 Thrombocytopenia, unspecified: Secondary | ICD-10-CM

## 2023-03-10 LAB — CBC
HCT: 35.8 % — ABNORMAL LOW (ref 36.0–46.0)
Hemoglobin: 11.6 g/dL — ABNORMAL LOW (ref 12.0–15.0)
MCH: 27.8 pg (ref 26.0–34.0)
MCHC: 32.4 g/dL (ref 30.0–36.0)
MCV: 85.6 fL (ref 80.0–100.0)
Platelets: 89 10*3/uL — ABNORMAL LOW (ref 150–400)
RBC: 4.18 MIL/uL (ref 3.87–5.11)
RDW: 13.4 % (ref 11.5–15.5)
WBC: 23.5 10*3/uL — ABNORMAL HIGH (ref 4.0–10.5)
nRBC: 0 % (ref 0.0–0.2)

## 2023-03-10 LAB — RHOGAM INJECTION: Unit division: 0

## 2023-03-10 LAB — HAPTOGLOBIN: Haptoglobin: 74 mg/dL (ref 33–278)

## 2023-03-10 MED ORDER — DEXAMETHASONE 20 MG PO TABS: 40.0000 mg | ORAL_TABLET | Freq: Every day | ORAL | 0 refills | Status: AC

## 2023-03-10 NOTE — Plan of Care (Signed)
  Problem: Clinical Measurements: Goal: Ability to maintain clinical measurements within normal limits will improve Outcome: Progressing Goal: Will remain free from infection Outcome: Progressing Goal: Diagnostic test results will improve Outcome: Progressing   Problem: Skin Integrity: Goal: Risk for impaired skin integrity will decrease Outcome: Progressing   

## 2023-03-10 NOTE — Progress Notes (Signed)
Hematology/Oncology Progress note [phone] [phone]     Patient Care Team: Patient, No Pcp Per as PCP - General (General Practice)   Name of the patient: Cindy [name]  [id number]  08-Jun-1986  Date of visit: 03/10/23   INTERVAL HISTORY-   No acute overnight event.  She tolerated IVIG yesterday. She received a RhoGAM to prevent the alloimmunization after receiving platelet from Rh+ donor   Allergies  Allergen Reactions   Cefaclor     REACTION: Hives    Patient Active Problem List   Diagnosis Date Noted   Thrombocytopenia (HCC) 03/09/2023   Petechiae 03/09/2023   Idiopathic thrombocytopenic purpura (ITP) (HCC) 03/08/2023   Bipolar disorder (HCC) 03/08/2023   GERD (gastroesophageal reflux disease) 03/08/2023   Palpitation 03/08/2023   Leukocytosis, unspecified 02/24/2014   Severe recurrent major depression without psychotic features (HCC) 09/23/2013   Acne 04/27/2013   Anemia, iron deficiency 04/27/2013   Bariatric surgery status 04/27/2013   B12 deficiency 04/27/2013   Unspecified vitamin D deficiency 04/27/2013   ANXIETY 11/02/2008     Past Medical History:  Diagnosis Date   Bipolar disorder (HCC)    Depression    History of chicken pox    around age 15   History of colonic diverticulitis    once at age 33   History of hay fever    History of heartburn    and GERD sometimes    History of high blood pressure    History of migraine    History of UTI    Obesity      Past Surgical History:  Procedure Laterality Date   GASTRIC BYPASS  12/2008   LEAP     TONSILLECTOMY AND ADENOIDECTOMY  1991    Social History   Socioeconomic History   Marital status: Divorced    Spouse name: Not on file   Number of children: Not on file   Years of education: Not on file   Highest education level: Not on file  Occupational History   Not on file  Tobacco Use   Smoking status: Never   Smokeless tobacco: Never  Substance and Sexual Activity    Alcohol use: Yes    Comment: occ   Drug use: No   Sexual activity: Not on file  Other Topics Concern   Not on file  Social History Narrative   Not on file   Social Determinants of Health   Financial Resource Strain: Not on file  Food Insecurity: No Food Insecurity (03/09/2023)   Hunger Vital Sign    Worried About Running Out of Food in the Last Year: Never true    Ran Out of Food in the Last Year: Never true  Transportation Needs: No Transportation Needs (03/09/2023)   PRAPARE - Administrator, Civil Service (Medical): No    Lack of Transportation (Non-Medical): No  Physical Activity: Not on file  Stress: Not on file  Social Connections: Not on file  Intimate Partner Violence: Not At Risk (03/09/2023)   Humiliation, Afraid, Rape, and Kick questionnaire    Fear of Current or Ex-Partner: No    Emotionally Abused: No    Physically Abused: No    Sexually Abused: No     Family History  Problem Relation Age of Onset   Schizophrenia Mother    Bipolar disorder Mother    Alcohol abuse Maternal Grandfather    Arthritis Mother        in her back   Heart disease  Paternal Grandmother    High blood pressure Father    Diabetes Paternal Aunt      Current Facility-Administered Medications:    0.9 %  sodium chloride infusion, 10 mL/hr, Intravenous, Once, Willy Eddy, MD, Held at 03/08/23 1758   acetaminophen (TYLENOL) tablet 650 mg, 650 mg, Oral, Q6H PRN, Lorretta Harp, MD, 650 mg at 03/09/23 1900   acetaminophen (TYLENOL) tablet 650 mg, 650 mg, Oral, Daily PRN, [name], MD   ARIPiprazole (ABILIFY) tablet 10 mg, 10 mg, Oral, q AM, Lorretta Harp, MD, 10 mg at 03/10/23 0654   dexamethasone (DECADRON) injection 40 mg, 40 mg, Intravenous, Daily, [name], MD, 40 mg at 03/10/23 1207   diphenhydrAMINE (BENADRYL) capsule 25 mg, 25 mg, Oral, QHS PRN, Mansy, Jan A, MD, 25 mg at 03/09/23 2035   diphenhydrAMINE (BENADRYL) capsule 50 mg, 50 mg, Oral, Daily PRN, [name], MD    lamoTRIgine (LAMICTAL) tablet 150 mg, 150 mg, Oral, BID, Lorretta Harp, MD, 150 mg at 03/10/23 1610   lithium carbonate (LITHOBID) ER tablet 300 mg, 300 mg, Oral, BID, Lorretta Harp, MD, 300 mg at 03/10/23 9604   multivitamin with minerals tablet 1 tablet, 1 tablet, Oral, Daily, Lorretta Harp, MD, 1 tablet at 03/10/23 1000   naltrexone (DEPADE) tablet 50 mg, 50 mg, Oral, QHS, Lorretta Harp, MD, 50 mg at 03/09/23 2251   OLANZapine (ZYPREXA) tablet 10 mg, 10 mg, Oral, QHS, Lorretta Harp, MD, 10 mg at 03/09/23 2251   ondansetron (ZOFRAN) injection 4 mg, 4 mg, Intravenous, Q8H PRN, Lorretta Harp, MD   pantoprazole (PROTONIX) EC tablet 40 mg, 40 mg, Oral, BID, Foye Deer, RPH, 40 mg at 03/10/23 1001   propranolol (INDERAL) tablet 30 mg, 30 mg, Oral, BID, Lorretta Harp, MD, 30 mg at 03/10/23 1000   Physical exam:  Vitals:   03/09/23 2245 03/10/23 0734 03/10/23 1200 03/10/23 1215  BP: 106/67 104/66 106/70 105/71  Pulse: 67 71 75 (!) 57  Resp: 18 17 18 18   Temp: 98 F (36.7 C) 97.8 F (36.6 C) 98.1 F (36.7 C) 98.7 F (37.1 C)  TempSrc:   Oral Oral  SpO2: 98% 99% 100% 100%  Weight:      Height:       Physical Exam Constitutional:      General: She is not in acute distress.    Appearance: She is not diaphoretic.  HENT:     Head: Normocephalic and atraumatic.  Eyes:     General: No scleral icterus.    Pupils: Pupils are equal, round, and reactive to light.  Cardiovascular:     Rate and Rhythm: Normal rate.  Pulmonary:     Effort: Pulmonary effort is normal. No respiratory distress.  Abdominal:     General: There is no distension.  Musculoskeletal:        General: Normal range of motion.     Cervical back: Normal range of motion and neck supple.  Skin:    Findings: No erythema.     Comments: Petechia  Neurological:     Mental Status: She is alert and oriented to person, place, and time. Mental status is at baseline.     Motor: No abnormal muscle tone.  Psychiatric:        Mood and Affect: Mood  and affect normal.       Labs    Latest Ref Rng & Units 03/10/2023    5:30 AM 03/09/2023    5:02 AM 03/08/2023    4:41 PM  CBC  WBC 4.0 - 10.5 K/uL 23.5  7.9    Hemoglobin 12.0 - 15.0 g/dL 16.1  09.6    Hematocrit 36.0 - 46.0 % 35.8  37.0    Platelets 150 - 400 K/uL 89  19  <5       Latest Ref Rng & Units 03/09/2023    5:02 AM 03/08/2023    3:06 PM 09/24/2013    6:34 AM  CMP  Glucose 70 - 99 mg/dL 045  93  97   BUN 6 - 20 mg/dL 11  8  9    Creatinine 0.44 - 1.00 mg/dL 4.09  8.11  9.14   Sodium 135 - 145 mmol/L 139  138  140   Potassium 3.5 - 5.1 mmol/L 4.2  4.0  4.0   Chloride 98 - 111 mmol/L 109  106  102   CO2 22 - 32 mmol/L 22  24  23    Calcium 8.9 - 10.3 mg/dL 9.7  9.3  9.5   Total Protein 6.5 - 8.1 g/dL  6.9  7.3   Total Bilirubin 0.3 - 1.2 mg/dL  0.7  0.3   Alkaline Phos 38 - 126 U/L  88  125   AST 15 - 41 U/L  15  27   ALT 0 - 44 U/L  21  52      RADIOGRAPHIC STUDIES: I have personally reviewed the radiological images as listed and agreed with the findings in the report. US Abdomen Complete  Result Date: 03/08/2023 CLINICAL DATA:  Idiopathic thrombocytopenia. EXAM: ABDOMEN ULTRASOUND COMPLETE COMPARISON:  None Available. FINDINGS: Gallbladder: No gallstones or wall thickening visualized. No sonographic Murphy sign noted by sonographer. Common bile duct: Diameter: 3 mm Liver: No focal lesion identified. Within normal limits in parenchymal echogenicity. Portal vein is patent on color Doppler imaging with normal direction of blood flow towards the liver. IVC: No abnormality visualized. Pancreas: Visualized portion unremarkable. Spleen: Size and appearance within normal limits. Right Kidney: Length: 10.2 cm. Echogenicity within normal limits. No mass or hydronephrosis visualized. Left Kidney: Length: 10.1 cm. Echogenicity within normal limits. No mass or hydronephrosis visualized. Abdominal aorta: No aneurysm visualized. Other findings: None. IMPRESSION: Unremarkable abdominal  ultrasound. Electronically Signed   By: Elgie Collard M.D.   On: 03/08/2023 19:16    Assessment and plan-   Acute thrombocytopenia, 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 Platelet count responded to dexamethasone and IVIG. Recommend patient to finish day 2 dose of IVIG today, dexamethasone 40 mg daily for total 4 days. She may be discharged from hematology aspect with follow-up appointment next week with me.  Already scheduled.  Patient had questions about receiving platelet transfusion from Rh+ donors.  Reassurance was provided.  She has received a RhoGAM to prevent allomineralization.  Thank you for allowing me to participate in the care of this patient.   [name], MD, PhD Hematology Oncology 03/10/2023

## 2023-03-10 NOTE — Discharge Summary (Signed)
Physician Discharge Summary   Patient: Cindy [name] MRN: [id number] DOB: [birth year]  Admit date:     03/08/2023  Discharge date: 03/10/23  Discharge Physician: Marcelino Duster   PCP: Patient, No Pcp Per   Recommendations at discharge:    PCP follow up in 1 week. Continue to monitor CBC for low platelets.  Complete 2 more days of decadron, and follow with hematology clinic as scheduled.  Discharge Diagnoses: Principal Problem:   Idiopathic thrombocytopenic purpura (ITP) (HCC) Active Problems:   ANXIETY   Bipolar disorder (HCC)   Palpitation   GERD (gastroesophageal reflux disease)   Thrombocytopenia (HCC)   Petechiae  Resolved Problems:   * No resolved hospital problems. [facility] Course: Cindy [name] is a 37 y.o. female with medical history significant of bipolar disorder, anxiety, anemia, GERD, migraine, diverticulitis, s/p of bariatric surgery, who presents with rash.  Patient's platelet count noted to be 6, admitted to hospitalist service for further management with hematology consultation.  Patient started on IV steroids, hematology consulted advised IVIG therapy along with steroids for possible ITP.  Patient did get 2 units of platelets from Rh+ donor and to prevent alloimmunization IM RhoGAM x 1 was given.  B12, folate normal.  Flow cytometry pending.  Today her platelet count is 89 and she is hemodynamically stable to be discharged home with PCP follow-up.  Advised to follow-up with hematology which is scheduled for next week.  Patient understands and agrees with the discharge plan.  Assessment and Plan: Idiopathic thrombocytopenic purpura- Initial platelets 6, started IV steroids. Patient got 2 units of platelets from Rh+ donor, I am milligram given. Hematology consulted advised to continue decadron x 4 days, IVIG for 2 days. Today platelet count is 89, white count 23.5 likely due to steroids. Hematology follow-up is scheduled for next week.   Bipolar  disorder- Continue lithium, Abilify, Lamictal, olanzapine.   History of palpitations- Continue propranolol.       Consultants: Hematology Procedures performed: none  Disposition: Home Diet recommendation:  Discharge Diet Orders (From admission, onward)     Start     Ordered   03/10/23 0000  Diet - low sodium heart healthy        03/10/23 1303           Cardiac diet DISCHARGE MEDICATION: Allergies as of 03/10/2023       Reactions   Cefaclor    REACTION: Hives        Medication List     TAKE these medications    ARIPiprazole 10 MG tablet Commonly known as: ABILIFY Take 10 mg by mouth in the morning.   dexAMETHasone 20 MG Tabs Take 40 mg by mouth daily for 2 days. Start taking on: March 11, 2023   lamoTRIgine 150 MG tablet Commonly known as: LAMICTAL Take 150 mg by mouth 2 (two) times daily.   lithium carbonate 300 MG ER tablet Commonly known as: LITHOBID Take 300 mg by mouth 2 (two) times daily.   multivitamin with minerals Tabs tablet Take 1 tablet by mouth daily.   naltrexone 50 MG tablet Commonly known as: DEPADE Take 50 mg by mouth at bedtime.   OLANZapine 10 MG tablet Commonly known as: ZYPREXA Take 10 mg by mouth at bedtime.   omeprazole 20 MG tablet Commonly known as: PRILOSEC OTC Take 20 mg by mouth 2 (two) times daily.   propranolol 20 MG tablet Commonly known as: INDERAL Take 30 mg by mouth 2 (two) times daily.  Discharge Exam: Filed Weights   03/08/23 1502  Weight: 70.3 kg   General -Young Caucasian female, no apparent distress HEENT - PERRLA, EOMI, atraumatic head, non tender sinuses. Lung - Clear, rales, rhonchi, wheezes. Heart - S1, S2 heard, no murmurs, rubs, no pedal edema Neuro - Alert, awake and oriented x 3, non focal exam. Skin - Warm and dry.  Petechial rash, bruises over extremities  Condition at discharge: stable  The results of significant diagnostics from this hospitalization (including imaging,  microbiology, ancillary and laboratory) are listed below for reference.   Imaging Studies: US Abdomen Complete  Result Date: 03/08/2023 CLINICAL DATA:  Idiopathic thrombocytopenia. EXAM: ABDOMEN ULTRASOUND COMPLETE COMPARISON:  None Available. FINDINGS: Gallbladder: No gallstones or wall thickening visualized. No sonographic Murphy sign noted by sonographer. Common bile duct: Diameter: 3 mm Liver: No focal lesion identified. Within normal limits in parenchymal echogenicity. Portal vein is patent on color Doppler imaging with normal direction of blood flow towards the liver. IVC: No abnormality visualized. Pancreas: Visualized portion unremarkable. Spleen: Size and appearance within normal limits. Right Kidney: Length: 10.2 cm. Echogenicity within normal limits. No mass or hydronephrosis visualized. Left Kidney: Length: 10.1 cm. Echogenicity within normal limits. No mass or hydronephrosis visualized. Abdominal aorta: No aneurysm visualized. Other findings: None. IMPRESSION: Unremarkable abdominal ultrasound. Electronically Signed   By: Elgie Collard M.D.   On: 03/08/2023 19:16    Microbiology: Results for orders placed or performed in visit on 02/24/14  Urine culture     Status: None   Collection Time: 02/24/14 11:19 AM   Specimen: Urine  Result Value Ref Range Status   Colony Count 20,OOO COLONIES/ML  Final   Organism ID, Bacteria DIPTHEROIDS (CORYNEBACTERIUM SPECIES)  Final    Comment: Standardized susceptibility testing for this organism is not available.    Labs: CBC: Recent Labs  Lab 03/08/23 1506 03/08/23 1641 03/09/23 0502 03/10/23 0530  WBC 10.5  --  7.9 23.5*  NEUTROABS 6.7  --  7.1  --   HGB 12.6  --  12.1 11.6*  HCT 39.9  --  37.0 35.8*  MCV 88.5  --  85.1 85.6  PLT 6* <5* 19* 89*   Basic Metabolic Panel: Recent Labs  Lab 03/08/23 1506 03/09/23 0502  NA 138 139  K 4.0 4.2  CL 106 109  CO2 24 22  GLUCOSE 93 177*  BUN 8 11  CREATININE 0.87 0.78  CALCIUM 9.3  9.7   Liver Function Tests: Recent Labs  Lab 03/08/23 1506  AST 15  ALT 21  ALKPHOS 88  BILITOT 0.7  PROT 6.9  ALBUMIN 4.4   CBG: No results for input(s): "GLUCAP" in the last 168 hours.  Discharge time spent: greater than 30 minutes.  Signed: Marcelino Duster, MD [facility] 03/10/2023

## 2023-03-11 ENCOUNTER — Other Ambulatory Visit: Payer: Self-pay

## 2023-03-11 ENCOUNTER — Ambulatory Visit: Payer: Self-pay | Admitting: Oncology

## 2023-03-11 MED FILL — Immune Globulin (Human) IV Soln 10 GM/100ML: INTRAVENOUS | Qty: 700 | Status: AC

## 2023-03-12 ENCOUNTER — Telehealth: Payer: Self-pay | Admitting: Oncology

## 2023-03-12 LAB — BPAM PLATELET PHERESIS
ISSUE DATE / TIME: 202406161741
Unit Type and Rh: 7300

## 2023-03-12 LAB — COMP PANEL: LEUKEMIA/LYMPHOMA

## 2023-03-12 LAB — PREPARE PLATELET PHERESIS: Unit division: 0

## 2023-03-12 NOTE — Telephone Encounter (Signed)
Pt called to r/s her appt (6/24)  Pt stated that she could do early morning or late afternoon any other day. I asked her about times that I had per Dr.Yu's schedule and pt was unhappy with those times as she wanted later than I was able to schedule.   Pt asked if she could see a different Dr. With more availability on their schedule. I told her I would have to ask my leadership.  We r/s appt to 6/25 (lab/MD) but pt still wants me to ask about a different provider schedule that would work with her schedule.

## 2023-03-16 ENCOUNTER — Inpatient Hospital Stay: Payer: Self-pay

## 2023-03-16 ENCOUNTER — Inpatient Hospital Stay: Payer: Self-pay | Admitting: Oncology

## 2023-03-17 ENCOUNTER — Inpatient Hospital Stay (HOSPITAL_BASED_OUTPATIENT_CLINIC_OR_DEPARTMENT_OTHER): Payer: Self-pay | Admitting: Oncology

## 2023-03-17 ENCOUNTER — Encounter: Payer: Self-pay | Admitting: Oncology

## 2023-03-17 ENCOUNTER — Inpatient Hospital Stay: Payer: Self-pay | Attending: Oncology

## 2023-03-17 ENCOUNTER — Other Ambulatory Visit: Payer: Self-pay

## 2023-03-17 VITALS — BP 111/91 | HR 82 | Temp 97.8°F | Resp 20 | Ht 69.5 in | Wt 156.0 lb

## 2023-03-17 DIAGNOSIS — D72829 Elevated white blood cell count, unspecified: Secondary | ICD-10-CM | POA: Insufficient documentation

## 2023-03-17 DIAGNOSIS — D696 Thrombocytopenia, unspecified: Secondary | ICD-10-CM | POA: Insufficient documentation

## 2023-03-17 DIAGNOSIS — D72821 Monocytosis (symptomatic): Secondary | ICD-10-CM

## 2023-03-17 LAB — CBC WITH DIFFERENTIAL (CANCER CENTER ONLY)
Abs Immature Granulocytes: 0.16 10*3/uL — ABNORMAL HIGH (ref 0.00–0.07)
Basophils Absolute: 0.1 10*3/uL (ref 0.0–0.1)
Basophils Relative: 0 %
Eosinophils Absolute: 0 10*3/uL (ref 0.0–0.5)
Eosinophils Relative: 0 %
HCT: 41.7 % (ref 36.0–46.0)
Hemoglobin: 13.7 g/dL (ref 12.0–15.0)
Immature Granulocytes: 1 %
Lymphocytes Relative: 24 %
Lymphs Abs: 3.2 10*3/uL (ref 0.7–4.0)
MCH: 27.8 pg (ref 26.0–34.0)
MCHC: 32.9 g/dL (ref 30.0–36.0)
MCV: 84.6 fL (ref 80.0–100.0)
Monocytes Absolute: 0.7 10*3/uL (ref 0.1–1.0)
Monocytes Relative: 5 %
Neutro Abs: 9 10*3/uL — ABNORMAL HIGH (ref 1.7–7.7)
Neutrophils Relative %: 70 %
Platelet Count: 507 10*3/uL — ABNORMAL HIGH (ref 150–400)
RBC: 4.93 MIL/uL (ref 3.87–5.11)
RDW: 13.2 % (ref 11.5–15.5)
WBC Count: 13 10*3/uL — ABNORMAL HIGH (ref 4.0–10.5)
nRBC: 0 % (ref 0.0–0.2)

## 2023-03-17 LAB — SAMPLE TO BLOOD BANK

## 2023-03-17 NOTE — Assessment & Plan Note (Signed)
Thrombocytopenia, 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  No splenomegaly on physical examination and ultrasound. Labs reviewed and discussed with patient. Platelet count has increased to 507000.  Recommend continue observation. Repeat CBC in 2 weeks, 4 weeks, 8 weeks, 12 weeks She will follow-up with me in 4 months.

## 2023-03-17 NOTE — Progress Notes (Signed)
Hematology/Oncology Progress note Telephone:([phone]) [phone] Fax:(336) [phone]   REASON OF VISIT  Follow-up for thrombocytopenia  ASSESSMENT & PLAN:   Thrombocytopenia (HCC) Thrombocytopenia, 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  No splenomegaly on physical examination and ultrasound. Labs reviewed and discussed with patient. Platelet count has increased to 507000.  Recommend continue observation. Repeat CBC in 2 weeks, 4 weeks, 8 weeks, 12 weeks She will follow-up with me in 4 months.  Leukocytosis Improved.   Possibly secondary to recent steroid use.  Continue monitor. Flow cytometry showed no significant phenotypic abnormalities.  She has Monocytosis, and aberrant expression of CD56.  This is a nonspecific finding.  I will repeat another flow cytometry in the future.  [facility] prescription was provided to patient. Follow-up in 4 months. All questions were answered. The patient knows to call the clinic with any problems, questions or concerns. No barriers to learning was detected.  Cindy Patience, MD, PhD [facility] Hematology Oncology 03/17/2023    INTERVAL HISTORY: Cindy [name] 37 y.o. female presents for follow up of   PERTINENT HEMATOLOGIC HISTORY: 03/08/2023 - 03/10/2023, patient was hospitalized due to thrombocytopenia, working diagnosis was ITP.  Initial platelet count was 6000. Patient received steroids, IVIG 1 mg/kg daily for 2 doses.  At discharge, platelet count increased to 89,000.  INTERVAL HISTORY Cindy [name] is a 37 y.o. female who has above history reviewed by me today presents for follow up visit for thrombocytopenia Patient was accompanied by her parents. She has no new complaints since her discharge. Denies any bleeding events. Patient has had intentional weight loss secondary to [facility] which was stopped 3 months ago.   ALLERGIES:  is allergic to cefaclor.  MEDICATIONS:  Current Outpatient Medications  Medication Sig Dispense Refill   ARIPiprazole (ABILIFY) 10 MG  tablet Take 10 mg by mouth in the morning.     lamoTRIgine (LAMICTAL) 150 MG tablet Take 150 mg by mouth 2 (two) times daily.     lithium carbonate (LITHOBID) 300 MG ER tablet Take 300 mg by mouth 2 (two) times daily.     Multiple Vitamin (MULTIVITAMIN WITH MINERALS) TABS Take 1 tablet by mouth daily.     naltrexone (DEPADE) 50 MG tablet Take 50 mg by mouth at bedtime.     OLANZapine (ZYPREXA) 10 MG tablet Take 10 mg by mouth at bedtime.     omeprazole (PRILOSEC OTC) 20 MG tablet Take 20 mg by mouth 2 (two) times daily.     propranolol (INDERAL) 20 MG tablet Take 30 mg by mouth 2 (two) times daily.     No current facility-administered medications for this visit.     Review of Systems  Constitutional:  Negative for appetite change, chills, fatigue and fever.  HENT:   Negative for hearing loss and voice change.   Eyes:  Negative for eye problems.  Respiratory:  Negative for chest tightness and cough.   Cardiovascular:  Negative for chest pain.  Gastrointestinal:  Negative for abdominal distention, abdominal pain and blood in stool.  Endocrine: Negative for hot flashes.  Genitourinary:  Negative for difficulty urinating and frequency.   Musculoskeletal:  Negative for arthralgias.  Skin:  Negative for itching and rash.  Neurological:  Negative for extremity weakness.  Hematological:  Negative for adenopathy.  Psychiatric/Behavioral:  Negative for confusion.      PHYSICAL EXAMINATION: ECOG PERFORMANCE STATUS: 0 - Asymptomatic  Vitals:   03/17/23 1458  BP: (!) 111/91  Pulse: 82  Resp: 20  Temp: 97.8 F (36.6 C)  Filed Weights   03/17/23 1458  Weight: 156 lb (70.8 kg)    Physical Exam Constitutional:      General: She is not in acute distress.    Appearance: She is not diaphoretic.  HENT:     Head: Normocephalic and atraumatic.  Eyes:     General: No scleral icterus. Cardiovascular:     Rate and Rhythm: Normal rate.  Pulmonary:     Effort: Pulmonary effort is normal.  No respiratory distress.  Chest:     Chest wall: No tenderness.  Abdominal:     General: There is no distension.  Musculoskeletal:        General: Normal range of motion.     Cervical back: Normal range of motion.  Skin:    Findings: No erythema.  Neurological:     Mental Status: She is alert and oriented to person, place, and time. Mental status is at baseline.     Cranial Nerves: No cranial nerve deficit.     Motor: No abnormal muscle tone.  Psychiatric:        Mood and Affect: Mood and affect normal.      LABORATORY DATA:  I have reviewed the data as listed    Latest Ref Rng & Units 03/17/2023    2:39 PM 03/10/2023    5:30 AM 03/09/2023    5:02 AM  CBC  WBC 4.0 - 10.5 K/uL 13.0  23.5  7.9   Hemoglobin 12.0 - 15.0 g/dL 16.1  09.6  04.5   Hematocrit 36.0 - 46.0 % 41.7  35.8  37.0   Platelets 150 - 400 K/uL 507  89  19       Latest Ref Rng & Units 03/09/2023    5:02 AM 03/08/2023    3:06 PM 09/24/2013    6:34 AM  CMP  Glucose 70 - 99 mg/dL 409  93  97   BUN 6 - 20 mg/dL 11  8  9    Creatinine 0.44 - 1.00 mg/dL 8.11  9.14  7.82   Sodium 135 - 145 mmol/L 139  138  140   Potassium 3.5 - 5.1 mmol/L 4.2  4.0  4.0   Chloride 98 - 111 mmol/L 109  106  102   CO2 22 - 32 mmol/L 22  24  23    Calcium 8.9 - 10.3 mg/dL 9.7  9.3  9.5   Total Protein 6.5 - 8.1 g/dL  6.9  7.3   Total Bilirubin 0.3 - 1.2 mg/dL  0.7  0.3   Alkaline Phos 38 - 126 U/L  88  125   AST 15 - 41 U/L  15  27   ALT 0 - 44 U/L  21  52      RADIOGRAPHIC STUDIES: I have personally reviewed the radiological images as listed and agreed with the findings in the report. No results found.

## 2023-03-17 NOTE — Assessment & Plan Note (Signed)
Improved.   Possibly secondary to recent steroid use.  Continue monitor. Flow cytometry showed no significant phenotypic abnormalities.  She has Monocytosis, and aberrant expression of CD56.  This is a nonspecific finding.  I will repeat another flow cytometry in the future.

## 2023-03-18 ENCOUNTER — Ambulatory Visit: Payer: Self-pay | Admitting: Oncology

## 2023-03-18 ENCOUNTER — Other Ambulatory Visit: Payer: Self-pay

## 2023-03-18 ENCOUNTER — Telehealth: Payer: Self-pay

## 2023-03-18 NOTE — Telephone Encounter (Signed)
[facility] order mailed to pt.

## 2023-03-18 NOTE — Telephone Encounter (Signed)
-----   Message from [name], MD sent at 03/17/2023  8:27 PM EDT ----- Please also provide her a [facility] prescription for peripheral blood flow cytometry.  Let her know that flow cytometry result is good except there or increased white blood count which can be reactive.  Nonspecific findings.  I would like to repeat the test in 4 months.  Thank you

## 2023-04-06 ENCOUNTER — Encounter: Payer: Self-pay | Admitting: Oncology

## 2023-04-06 ENCOUNTER — Telehealth: Payer: Self-pay | Admitting: *Deleted

## 2023-04-06 NOTE — Telephone Encounter (Signed)
Patient called reporting that she would like a return call to discuss her lab results that are concerning

## 2023-04-07 NOTE — Telephone Encounter (Signed)
Call returned to patient and informed of physician response She stated "OK, thank you"

## 2023-04-27 ENCOUNTER — Encounter: Payer: Self-pay | Admitting: Oncology

## 2023-06-16 ENCOUNTER — Encounter: Payer: Self-pay | Admitting: Oncology

## 2023-07-20 ENCOUNTER — Telehealth: Payer: Self-pay | Admitting: Oncology

## 2023-08-04 ENCOUNTER — Telehealth: Payer: Self-pay | Admitting: Oncology

## 2023-09-03 ENCOUNTER — Inpatient Hospital Stay: Payer: PRIVATE HEALTH INSURANCE | Admitting: Oncology

## 2023-09-08 ENCOUNTER — Encounter: Payer: Self-pay | Admitting: Oncology

## 2023-09-10 ENCOUNTER — Encounter: Payer: Self-pay | Admitting: Oncology

## 2023-09-10 ENCOUNTER — Inpatient Hospital Stay: Payer: PRIVATE HEALTH INSURANCE | Attending: Oncology | Admitting: Oncology

## 2023-09-10 DIAGNOSIS — D696 Thrombocytopenia, unspecified: Secondary | ICD-10-CM | POA: Diagnosis not present

## 2023-09-10 DIAGNOSIS — D72821 Monocytosis (symptomatic): Secondary | ICD-10-CM

## 2023-09-10 NOTE — Assessment & Plan Note (Signed)
Intermittent leukocyosis, predominantly neutrophilia.  Flow cytometry showed no significant phenotypic abnormalities.  She has Monocytosis, and aberrant expression of CD56.  This is a nonspecific finding.  I will repeat another flow cytometry in the future.

## 2023-09-10 NOTE — Assessment & Plan Note (Signed)
Thrombocytopenia, 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  No splenomegaly on physical examination and ultrasound. Labs reviewed and discussed with patient. Stable platelet count, within normal limits.

## 2023-09-10 NOTE — Progress Notes (Signed)
HEMATOLOGY-ONCOLOGY TeleHEALTH VISIT PROGRESS NOTE  I connected with Cindy [name] on 09/10/23  at  3:00 PM EST by video enabled telemedicine visit and verified that I am speaking with the correct person using two identifiers. I discussed the limitations, risks, security and privacy concerns of performing an evaluation and management service by telemedicine and the availability of in-person appointments. The patient expressed understanding and agreed to proceed.   Other persons participating in the visit and their role in the encounter:  None  Patient's location: in her vehicle, not driving Provider's location: office Chief Complaint: thrombocytopenia and leukocytosis   INTERVAL HISTORY Cindy [name] is a 37 y.o. female who has above history reviewed by me today presents for follow up visit for management of  thrombocytopenia and leukocytosis She reports feeling well. No recent infection or steroid use.   Review of Systems  Constitutional:  Negative for appetite change, chills, fatigue and fever.  HENT:   Negative for hearing loss and voice change.   Eyes:  Negative for eye problems.  Respiratory:  Negative for chest tightness and cough.   Cardiovascular:  Negative for chest pain.  Gastrointestinal:  Negative for abdominal distention, abdominal pain and blood in stool.  Endocrine: Negative for hot flashes.  Genitourinary:  Negative for difficulty urinating and frequency.   Musculoskeletal:  Negative for arthralgias.  Skin:  Negative for itching and rash.  Neurological:  Negative for extremity weakness.  Hematological:  Negative for adenopathy.  Psychiatric/Behavioral:  Negative for confusion.     Past Medical History:  Diagnosis Date   Bipolar disorder (HCC)    Depression    GERD (gastroesophageal reflux disease)    History of chicken pox    around age 67   History of colonic diverticulitis    once at age 29   History of hay fever    History of heartburn    and GERD sometimes     History of high blood pressure    History of migraine    History of UTI    Obesity    Past Surgical History:  Procedure Laterality Date   GASTRIC BYPASS  12/21/2008   GASTRIC BYPASS  2010   LEAP     TONSILLECTOMY AND ADENOIDECTOMY  09/22/1989    Family History  Problem Relation Age of Onset   Schizophrenia Mother    Bipolar disorder Mother    Arthritis Mother        in her back   High blood pressure Father    Diabetes Paternal Aunt    Hypertension Maternal Grandmother    Alcohol abuse Maternal Grandfather    Heart disease Paternal Grandmother     Social History   Socioeconomic History   Marital status: Divorced    Spouse name: Not on file   Number of children: Not on file   Years of education: Not on file   Highest education level: Not on file  Occupational History   Not on file  Tobacco Use   Smoking status: Never   Smokeless tobacco: Never  Vaping Use   Vaping status: Not on file  Substance and Sexual Activity   Alcohol use: Not Currently    Comment: occ   Drug use: No   Sexual activity: Yes  Other Topics Concern   Not on file  Social History Narrative   Not on file   Social Drivers of Health   Financial Resource Strain: Not on file  Food Insecurity: No Food Insecurity (03/09/2023)  Hunger Vital Sign    Worried About Running Out of Food in the Last Year: Never true    Ran Out of Food in the Last Year: Never true  Transportation Needs: No Transportation Needs (03/17/2023)   PRAPARE - Administrator, Civil Service (Medical): No    Lack of Transportation (Non-Medical): No  Physical Activity: Not on file  Stress: Not on file  Social Connections: Not on file  Intimate Partner Violence: Not At Risk (03/17/2023)   Humiliation, Afraid, Rape, and Kick questionnaire    Fear of Current or Ex-Partner: No    Emotionally Abused: No    Physically Abused: No    Sexually Abused: No    Current Outpatient Medications on File Prior to Visit  Medication  Sig Dispense Refill   ARIPiprazole (ABILIFY) 10 MG tablet Take 10 mg by mouth in the morning.     lamoTRIgine (LAMICTAL) 150 MG tablet Take 150 mg by mouth 2 (two) times daily.     lithium carbonate (LITHOBID) 300 MG ER tablet Take 300 mg by mouth 2 (two) times daily.     Multiple Vitamin (MULTIVITAMIN WITH MINERALS) TABS Take 1 tablet by mouth daily.     naltrexone (DEPADE) 50 MG tablet Take 50 mg by mouth at bedtime.     OLANZapine (ZYPREXA) 10 MG tablet Take 10 mg by mouth at bedtime.     omeprazole (PRILOSEC OTC) 20 MG tablet Take 20 mg by mouth 2 (two) times daily.     propranolol (INDERAL) 20 MG tablet Take 30 mg by mouth 2 (two) times daily.     No current facility-administered medications on file prior to visit.    Allergies  Allergen Reactions   Cefaclor     REACTION: Hives       Observations/Objective: There were no vitals filed for this visit. There is no height or weight on file to calculate BMI.  Physical Exam Neurological:     Mental Status: She is alert.     CBC    Component Value Date/Time   WBC 13.0 (H) 03/17/2023 1439   WBC 23.5 (H) 03/10/2023 0530   RBC 4.93 03/17/2023 1439   HGB 13.7 03/17/2023 1439   HCT 41.7 03/17/2023 1439   PLT 507 (H) 03/17/2023 1439   MCV 84.6 03/17/2023 1439   MCH 27.8 03/17/2023 1439   MCHC 32.9 03/17/2023 1439   RDW 13.2 03/17/2023 1439   LYMPHSABS 3.2 03/17/2023 1439   MONOABS 0.7 03/17/2023 1439   EOSABS 0.0 03/17/2023 1439   BASOSABS 0.1 03/17/2023 1439    CMP     Component Value Date/Time   NA 139 03/09/2023 0502   K 4.2 03/09/2023 0502   CL 109 03/09/2023 0502   CO2 22 03/09/2023 0502   GLUCOSE 177 (H) 03/09/2023 0502   BUN 11 03/09/2023 0502   CREATININE 0.78 03/09/2023 0502   CALCIUM 9.7 03/09/2023 0502   PROT 6.9 03/08/2023 1506   ALBUMIN 4.4 03/08/2023 1506   AST 15 03/08/2023 1506   ALT 21 03/08/2023 1506   ALKPHOS 88 03/08/2023 1506   BILITOT 0.7 03/08/2023 1506   GFRNONAA >60 03/09/2023 0502    GFRAA >90 09/24/2013 0634     ASSESSMENT & PLAN:   Thrombocytopenia (HCC) Thrombocytopenia, 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  No splenomegaly on physical examination and ultrasound. Labs reviewed and discussed with patient. Stable platelet count, within normal limits.    Leukocytosis Intermittent leukocyosis, predominantly neutrophilia.  Flow cytometry showed no significant phenotypic  abnormalities.  She has Monocytosis, and aberrant expression of CD56.  This is a nonspecific finding.  I will repeat another flow cytometry in the future.  Orders Placed This Encounter  Procedures   CBC with Differential (Cancer Center Only)    Standing Status:   Future    Expected Date:   03/10/2024    Expiration Date:   09/09/2024   Flow cytometry panel-leukemia/lymphoma work-up    Standing Status:   Future    Expected Date:   03/10/2024    Expiration Date:   09/09/2024   Follow up in 6 months, she prefers virtual visit  All questions were answered. The patient knows to call the clinic with any problems, questions or concerns.   [name], MD 09/10/2023 9:34 PM

## 2024-01-07 ENCOUNTER — Telehealth: Payer: Self-pay

## 2024-01-07 DIAGNOSIS — D696 Thrombocytopenia, unspecified: Secondary | ICD-10-CM

## 2024-01-07 NOTE — Telephone Encounter (Signed)
 Test order form faxed to [facility] in [facility]

## 2024-01-07 NOTE — Telephone Encounter (Signed)
 Per Dr. Wilhelmenia Harada, ok to move up appts with labs being 1 week prior to MD. Pt usually gets labs at [facility] but if she can get them done here, we can schedule MD  3 days after labs. We can fax order to [facility] if she can tells us  which one she uses.

## 2024-01-07 NOTE — Telephone Encounter (Signed)
 Patient notified of MD recommendation and is OK with the plan.  Will have labs drawn at [facility] in [place] on [address] in [place] P: [phone] F: [phone]  Please r/s 02/2024 virtual visit to mid-late next week.  Pt would like a call with appt details.

## 2024-01-07 NOTE — Telephone Encounter (Signed)
 Patient is feeling more fatigued and sleeping a lot.  Wants to know if Dr. Wilhelmenia [name] will draw her labs now to ensure stable.

## 2024-01-13 ENCOUNTER — Inpatient Hospital Stay: Payer: PRIVATE HEALTH INSURANCE | Admitting: Oncology

## 2024-01-18 ENCOUNTER — Inpatient Hospital Stay: Payer: PRIVATE HEALTH INSURANCE | Attending: Oncology | Admitting: Oncology

## 2024-01-18 ENCOUNTER — Encounter: Payer: Self-pay | Admitting: Oncology

## 2024-01-18 DIAGNOSIS — D72821 Monocytosis (symptomatic): Secondary | ICD-10-CM

## 2024-01-18 DIAGNOSIS — D72829 Elevated white blood cell count, unspecified: Secondary | ICD-10-CM | POA: Diagnosis not present

## 2024-01-18 DIAGNOSIS — D696 Thrombocytopenia, unspecified: Secondary | ICD-10-CM

## 2024-01-18 NOTE — Progress Notes (Signed)
 HEMATOLOGY-ONCOLOGY TeleHEALTH VISIT PROGRESS NOTE  I connected with Cindy [name] on 01/18/24  at  2:30 PM EDT by video enabled telemedicine visit and verified that I am speaking with the correct person using two identifiers. I discussed the limitations, risks, security and privacy concerns of performing an evaluation and management service by telemedicine and the availability of in-person appointments. The patient expressed understanding and agreed to proceed.   Other persons participating in the visit and their role in the encounter:  None  Patient's location: in her vehicle, not driving Provider's location: office Chief Complaint: thrombocytopenia and leukocytosis   INTERVAL HISTORY Cindy [name] is a 38 y.o. female who has above history reviewed by me today presents for follow up visit for management of  thrombocytopenia and leukocytosis She reports feeling well.  Patient denies any new complaints.    Review of Systems  Constitutional:  Negative for appetite change, chills, fatigue and fever.  HENT:   Negative for hearing loss and voice change.   Eyes:  Negative for eye problems.  Respiratory:  Negative for chest tightness and cough.   Cardiovascular:  Negative for chest pain.  Gastrointestinal:  Negative for abdominal distention, abdominal pain and blood in stool.  Endocrine: Negative for hot flashes.  Genitourinary:  Negative for difficulty urinating and frequency.   Musculoskeletal:  Negative for arthralgias.  Skin:  Negative for itching and rash.  Neurological:  Negative for extremity weakness.  Hematological:  Negative for adenopathy.  Psychiatric/Behavioral:  Negative for confusion.     Past Medical History:  Diagnosis Date   Bipolar disorder (HCC)    Depression    GERD (gastroesophageal reflux disease)    History of chicken pox    around age 47   History of colonic diverticulitis    once at age 47   History of hay fever    History of heartburn    and GERD sometimes     History of high blood pressure    History of migraine    History of UTI    Obesity    Past Surgical History:  Procedure Laterality Date   GASTRIC BYPASS  12/21/2008   GASTRIC BYPASS  2010   LEAP     TONSILLECTOMY AND ADENOIDECTOMY  09/22/1989    Family History  Problem Relation Age of Onset   Schizophrenia Mother    Bipolar disorder Mother    Arthritis Mother        in her back   High blood pressure Father    Diabetes Paternal Aunt    Hypertension Maternal Grandmother    Alcohol abuse Maternal Grandfather    Heart disease Paternal Grandmother     Social History   Socioeconomic History   Marital status: Divorced    Spouse name: Not on file   Number of children: Not on file   Years of education: Not on file   Highest education level: Not on file  Occupational History   Not on file  Tobacco Use   Smoking status: Never   Smokeless tobacco: Never  Vaping Use   Vaping status: Not on file  Substance and Sexual Activity   Alcohol use: Not Currently    Comment: occ   Drug use: No   Sexual activity: Yes  Other Topics Concern   Not on file  Social History Narrative   Not on file   Social Drivers of Health   Financial Resource Strain: Not on file  Food Insecurity: No Food Insecurity (03/09/2023)  Hunger Vital Sign    Worried About Running Out of Food in the Last Year: Never true    Ran Out of Food in the Last Year: Never true  Transportation Needs: No Transportation Needs (03/17/2023)   PRAPARE - Administrator, Civil Service (Medical): No    Lack of Transportation (Non-Medical): No  Physical Activity: Not on file  Stress: Not on file  Social Connections: Not on file  Intimate Partner Violence: Not At Risk (03/17/2023)   Humiliation, Afraid, Rape, and Kick questionnaire    Fear of Current or Ex-Partner: No    Emotionally Abused: No    Physically Abused: No    Sexually Abused: No    Current Outpatient Medications on File Prior to Visit  Medication  Sig Dispense Refill   ARIPiprazole  (ABILIFY ) 10 MG tablet Take 10 mg by mouth in the morning.     lamoTRIgine  (LAMICTAL ) 150 MG tablet Take 150 mg by mouth 2 (two) times daily.     lithium  carbonate (LITHOBID ) 300 MG ER tablet Take 300 mg by mouth 2 (two) times daily.     Multiple Vitamin (MULTIVITAMIN WITH MINERALS) TABS Take 1 tablet by mouth daily.     naltrexone  (DEPADE) 50 MG tablet Take 50 mg by mouth at bedtime.     OLANZapine  (ZYPREXA ) 10 MG tablet Take 10 mg by mouth at bedtime.     omeprazole  (PRILOSEC  OTC) 20 MG tablet Take 20 mg by mouth 2 (two) times daily.     propranolol  (INDERAL ) 20 MG tablet Take 30 mg by mouth 2 (two) times daily.     No current facility-administered medications on file prior to visit.    Allergies  Allergen Reactions   Cefaclor     REACTION: Hives       Observations/Objective: There were no vitals filed for this visit. There is no height or weight on file to calculate BMI.  Physical Exam Neurological:     Mental Status: She is alert.     CBC    Component Value Date/Time   WBC 13.0 (H) 03/17/2023 1439   WBC 23.5 (H) 03/10/2023 0530   RBC 4.93 03/17/2023 1439   HGB 13.7 03/17/2023 1439   HCT 41.7 03/17/2023 1439   PLT 507 (H) 03/17/2023 1439   MCV 84.6 03/17/2023 1439   MCH 27.8 03/17/2023 1439   MCHC 32.9 03/17/2023 1439   RDW 13.2 03/17/2023 1439   LYMPHSABS 3.2 03/17/2023 1439   MONOABS 0.7 03/17/2023 1439   EOSABS 0.0 03/17/2023 1439   BASOSABS 0.1 03/17/2023 1439    CMP     Component Value Date/Time   NA 139 03/09/2023 0502   K 4.2 03/09/2023 0502   CL 109 03/09/2023 0502   CO2 22 03/09/2023 0502   GLUCOSE 177 (H) 03/09/2023 0502   BUN 11 03/09/2023 0502   CREATININE 0.78 03/09/2023 0502   CALCIUM  9.7 03/09/2023 0502   PROT 6.9 03/08/2023 1506   ALBUMIN 4.4 03/08/2023 1506   AST 15 03/08/2023 1506   ALT 21 03/08/2023 1506   ALKPHOS 88 03/08/2023 1506   BILITOT 0.7 03/08/2023 1506   GFRNONAA >60 03/09/2023 0502    GFRAA >90 09/24/2013 0634    ASSESSMENT & PLAN:   Leukocytosis Intermittent leukocyosis, predominantly neutrophilia.  Repeat Flow cytometry showed no significant phenotypic abnormalities.  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  This is a nonspecific finding.  Patient is asymptomatic.  I recommend observation.   Orders Placed This  Encounter  Procedures   CBC with Differential (Cancer Center Only)    Standing Status:   Future    Expected Date:   01/17/2025    Expiration Date:   01/17/2025   Flow cytometry panel-leukemia/lymphoma work-up    Standing Status:   Future    Expected Date:   01/17/2025    Expiration Date:   01/17/2025   Follow up in 6 months, she prefers virtual visit  All questions were answered. The patient knows to call the clinic with any problems, questions or concerns.   Timmy Forbes, MD 01/18/2024 9:29 PM

## 2024-01-18 NOTE — Assessment & Plan Note (Signed)
 Intermittent leukocyosis, predominantly neutrophilia.  Repeat Flow cytometry showed no significant phenotypic abnormalities.  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  This is a nonspecific finding.  Patient is asymptomatic.  I recommend observation.

## 2024-01-20 ENCOUNTER — Encounter: Payer: Self-pay | Admitting: Oncology

## 2024-03-17 ENCOUNTER — Telehealth: Payer: PRIVATE HEALTH INSURANCE | Admitting: Oncology

## 2025-01-19 ENCOUNTER — Telehealth: Payer: PRIVATE HEALTH INSURANCE | Admitting: Oncology
